# Patient Record
Sex: Male | Born: 1935 | Race: Black or African American | Hispanic: No | Marital: Single | Smoking: Current every day smoker
Health system: Southern US, Community
[De-identification: ages and names within clinical notes are randomized; demographics above are authoritative.]

## PROBLEM LIST (undated history)

## (undated) DIAGNOSIS — I110 Hypertensive heart disease with heart failure: Secondary | ICD-10-CM

## (undated) DIAGNOSIS — R911 Solitary pulmonary nodule: Secondary | ICD-10-CM

## (undated) DIAGNOSIS — E785 Hyperlipidemia, unspecified: Secondary | ICD-10-CM

## (undated) HISTORY — DX: Hypertensive heart disease with heart failure: I11.0

---

## 2003-01-24 ENCOUNTER — Inpatient Hospital Stay (HOSPITAL_COMMUNITY): Admission: EM | Admit: 2003-01-24 | Discharge: 2003-01-31 | Payer: Self-pay | Admitting: Emergency Medicine

## 2003-01-26 ENCOUNTER — Encounter (INDEPENDENT_AMBULATORY_CARE_PROVIDER_SITE_OTHER): Payer: Self-pay | Admitting: Specialist

## 2007-03-25 DIAGNOSIS — R911 Solitary pulmonary nodule: Secondary | ICD-10-CM

## 2007-03-25 HISTORY — DX: Solitary pulmonary nodule: R91.1

## 2007-05-28 ENCOUNTER — Encounter: Admission: RE | Admit: 2007-05-28 | Discharge: 2007-05-28 | Payer: Self-pay | Admitting: Emergency Medicine

## 2007-06-23 ENCOUNTER — Ambulatory Visit: Payer: Self-pay | Admitting: Thoracic Surgery

## 2007-06-30 ENCOUNTER — Ambulatory Visit: Admission: RE | Admit: 2007-06-30 | Discharge: 2007-06-30 | Payer: Self-pay | Admitting: Thoracic Surgery

## 2007-06-30 ENCOUNTER — Encounter: Payer: Self-pay | Admitting: Thoracic Surgery

## 2007-07-07 ENCOUNTER — Ambulatory Visit: Payer: Self-pay | Admitting: Thoracic Surgery

## 2007-09-01 ENCOUNTER — Ambulatory Visit: Payer: Self-pay | Admitting: Thoracic Surgery

## 2007-09-01 ENCOUNTER — Ambulatory Visit (HOSPITAL_COMMUNITY): Admission: RE | Admit: 2007-09-01 | Discharge: 2007-09-01 | Payer: Self-pay | Admitting: Thoracic Surgery

## 2007-09-01 ENCOUNTER — Encounter: Payer: Self-pay | Admitting: Thoracic Surgery

## 2007-09-02 ENCOUNTER — Ambulatory Visit: Payer: Self-pay | Admitting: Thoracic Surgery

## 2008-04-11 ENCOUNTER — Ambulatory Visit: Payer: Self-pay | Admitting: Internal Medicine

## 2008-05-15 ENCOUNTER — Emergency Department (HOSPITAL_COMMUNITY): Admission: EM | Admit: 2008-05-15 | Discharge: 2008-05-15 | Payer: Self-pay | Admitting: Family Medicine

## 2008-12-26 ENCOUNTER — Encounter: Admission: RE | Admit: 2008-12-26 | Discharge: 2008-12-26 | Payer: Self-pay | Admitting: Family Medicine

## 2010-03-28 ENCOUNTER — Emergency Department (HOSPITAL_COMMUNITY)
Admission: EM | Admit: 2010-03-28 | Discharge: 2010-03-28 | Payer: Self-pay | Source: Home / Self Care | Admitting: Emergency Medicine

## 2010-03-28 LAB — URINALYSIS, ROUTINE W REFLEX MICROSCOPIC
Bilirubin Urine: NEGATIVE
Hemoglobin, Urine: NEGATIVE
Ketones, ur: NEGATIVE mg/dL
Nitrite: NEGATIVE
Protein, ur: NEGATIVE mg/dL
Specific Gravity, Urine: 1.009 (ref 1.005–1.030)
Urine Glucose, Fasting: NEGATIVE mg/dL
Urobilinogen, UA: 0.2 mg/dL (ref 0.0–1.0)
pH: 5.5 (ref 5.0–8.0)

## 2010-03-28 LAB — POCT I-STAT, CHEM 8
BUN: 17 mg/dL (ref 6–23)
Calcium, Ion: 1.01 mmol/L — ABNORMAL LOW (ref 1.12–1.32)
Chloride: 105 mEq/L (ref 96–112)
Creatinine, Ser: 0.7 mg/dL (ref 0.4–1.5)
Glucose, Bld: 115 mg/dL — ABNORMAL HIGH (ref 70–99)
HCT: 44 % (ref 39.0–52.0)
Hemoglobin: 15 g/dL (ref 13.0–17.0)
Potassium: 5.7 mEq/L — ABNORMAL HIGH (ref 3.5–5.1)
Sodium: 135 mEq/L (ref 135–145)
TCO2: 28 mmol/L (ref 0–100)

## 2010-03-28 LAB — URINE MICROSCOPIC-ADD ON

## 2010-04-08 LAB — URINE CULTURE
Colony Count: 45000
Culture  Setup Time: 201201051903

## 2010-04-14 ENCOUNTER — Encounter: Payer: Self-pay | Admitting: Emergency Medicine

## 2010-04-14 ENCOUNTER — Encounter: Payer: Self-pay | Admitting: Thoracic Surgery

## 2010-04-14 ENCOUNTER — Encounter: Payer: Self-pay | Admitting: Family Medicine

## 2010-04-15 ENCOUNTER — Encounter: Payer: Self-pay | Admitting: Family Medicine

## 2010-08-06 NOTE — Letter (Signed)
September 02, 2007   Reuben Likes, M.D.  [address]  [place], [place] [address]   Re:  [name] [name], [name] [name]              DOB:  08-03-1933   Dear Dr. Leslee Home:   I saw this patient back today.  We finally got him to do a  bronchomediastinoscopy of his mediastinum.  His blood pressure was  118/65, pulse 84, respirations 18, and sats were 95%.  Lungs were clear  to auscultation and percussion.  His pathology showed that his  mediastinoscopy was negative.  His transbronchial biopsy showed atypical  cells that are highly suspicious for cancer, so I think it looks like he  has got a left upper lobe cancer, nonsmall cell lung cancer, and I have  recommended that he have a VATS left upper lobectomy with node  dissection.  We have tentatively scheduled for 09/28/2007, but he wants  to think about before making a final decision.   I appreciate the opportunity of seeing this patient.   Sincerely,   Ines Bloomer, M.D.  Electronically Signed   DPB/MEDQ  D:  09/02/2007  T:  09/03/2007  Job:  [id number]

## 2010-08-06 NOTE — Letter (Signed)
September 29, 2007   Mr. [name] [name]  [address]  [place], [place] [address]   Re:  MITHRAN, STRIKE              DOB:  [birth year]   Dear [name]:   You did not show up for the preoperative appointment or your scheduled  surgery on September 28, 2007.  We have been trying to contact to you and your  phones have been disconnected.  We feel that you need to contact us, so  that we can reschedule your surgery.  If not, we cannot guarantee that  you will get any type of good results from your lung cancer.  Right now,  we feel that this is the time but any wait will definitely impact your  treatment.  Please call us at [phone], so that we can reschedule.  It  is very important that you have this surgery, so that you have chance to  have a cure from your lung cancer.   Ines Bloomer, M.D.  Electronically Signed   DPB/MEDQ  D:  09/29/2007  T:  09/30/2007  Job:  [id number]

## 2010-08-06 NOTE — Assessment & Plan Note (Signed)
OFFICE VISIT   [name], [name] L  DOB:  01-21-1934                                        July 07, 2007  CHART #:  [id number]   Mr. Cure came for follow-up today after his PET scan which revealed  that the left upper lobe lesion was positive and there is a question of  positive mediastinal nodes.  He needs to have bronchoscopy and  mediastinoscopy which is what I have suggested, but he wants to discuss  this with Dr. Lorenz Coaster first.  I discussed this with he and his family.  His blood pressure is 118/72, pulse 100, respirations 18, sats 99%.  I  will see him back again in two months with a chest x-ray.  I will see  him back again if he describes to have his bronchoscopy and  mediastinoscopy.   Ines Bloomer, M.D.  Electronically Signed   DPB/MEDQ  D:  07/07/2007  T:  07/07/2007  Job:  [id number]

## 2010-08-06 NOTE — Letter (Signed)
July 07, 2007   Reuben Likes, M.D.  [address]  [place], [place] [address]   Re:  NORI, POLAND              DOB:  09/21/1933   Dear [name]:   I saw Mr. Merrick back today.  His pulmonary function tests were  improved with an FEV of 2.97, an FEV1 of 2.01, and a diffusion capacity  of 95%, which I think he would be a candidate for a lobectomy from  pulmonary function tests.  His PET scan, as suspected, was positive, not  in the left upper lobe, but there were some questionable positive nodes  in the mediastinum.  For this reason, I have recommended that he have  bronchoscopy and mediastinoscopy as a staging procedure prior to  considering surgery.  I discussed this in detail with him and his  daughter.  They want to discuss this with you, and they apparently will  call us to schedule surgery after they talk with you.   Ines Bloomer, M.D.  Electronically Signed   DPB/MEDQ  D:  07/07/2007  T:  07/07/2007  Job:  [id number]

## 2010-08-06 NOTE — H&P (Signed)
[name] [name], [name] [name]              ACCOUNT NO.:  [id number]   MEDICAL RECORD NO.:  [id number]         PATIENT TYPE:  CINP   LOCATION:                               FACILITY:  [facility]   PHYSICIAN:  Ines Bloomer, M.D. DATE OF BIRTH:  1933/08/27   DATE OF ADMISSION:  09/28/2007  DATE OF DISCHARGE:                              HISTORY & PHYSICAL   CHIEF COMPLAINT:  Left lung.   This 77 year old patient has a long history of smoking with a pack of  cigarettes a day.  He was found to have a lobulated and speculated left  upper lobe lesion.  It is 22 mm in diameters and questionable left  peritracheal adenopathy.  Pulmonary function tests showed an FEV-1 of  1.8.  Pulmonary function tests showed an FVC of 2.97 with an FEV-1 of  2.01 and 95% diffusion capacity.  He has had no hemoptysis, fever,  chills, or excessive sputum.  He underwent a PET scan, which revealed  the left upper lobe was positive and question mediastinal nodes.  Bronchoscopy and mediastinoscopy showed that there were atypical cells  in the left upper lobe suspicious for cancer and negative  mediastinoscopy.  He is scheduled for a VAT left upper lobe lobectomy  with node dissection.   His medications include Stelazine daily.   He has no allergies.   His family history is noncontributory.   SOCIAL HISTORY:  He is single, retired, smokes half-a-pack of cigarettes  a day and has tried and quit.  Does not drink alcohol on a regular  basis.   REVIEW OF SYSTEMS:  GENERAL: He has had some weight loss, loss of  appetite.  CARDIAC:  No angina or atrial fibrillation.  PULMONARY:  See  history of present illness.  GI: No nausea, vomiting, constipation, or  diarrhea.  GU: No dysuria or frequent urination.  VASCULAR:  No  claudication, DVT, or TIAs.  NEUROLOGICAL:  No dizziness, headaches,  blackouts, or seizures.  MUSCULOSKELETAL:  No arthritis.  PSYCHIATRIC:  He apparently has a history of schizophrenia and has been followed  by  Dr. Posey Rea.  EYES/ENT:  No changes in eyesight or hearing.  HEMATOLOGICAL:  No problems with bleeding, clotting disorders, or  anemia.   PHYSICAL EXAM:  This is a thin African American male in no acute  distress.  His blood pressure is 132/78, pulse 81, respirations 18, and  sats 98%.  Head is atraumatic.  Eyes, pupils are equal and reactive to  light and accommodation.  Extraocular movements are normal.  Ears,  tympanic membranes are intact.  Nose, there is no septal deviation.  Throat without lesion.  Uvula is nonenlarged.  Neck is supple without  thyromegaly.  Nose, no carotid bruits.  No supraclavicular axillary  adenopathy.  Chest is essentially clear, although there is slight wheeze  heard bilaterally.  Heart has regular sinus rhythm, no murmurs.  Abdomen  is soft.  No hepatosplenomegaly.  Extremities,  pulses are 2+.  There is  no clubbing or edema.  Neurologically, he is oriented x3.  Sensory and  motor intact.  Skin is without  lesion.   IMPRESSION:  1. Non-small cell cancer, left upper lobe.  2. Chronic obstructive pulmonary disease.  3. History of schizophrenia.  4. History of tobacco abuse.   PLAN:  Left upper lobectomy.      Ines Bloomer, M.D.  Electronically Signed     DPB/MEDQ  D:  09/23/2007  T:  09/24/2007  Job:  [id number]

## 2010-08-06 NOTE — Op Note (Signed)
[name] [name], [name] [name]              ACCOUNT NO.:  [id number]   MEDICAL RECORD NO.:  [id number]          PATIENT TYPE:  AMB   LOCATION:  DFTL                         FACILITY:  [facility]   PHYSICIAN:  Ines Bloomer, M.D. DATE OF BIRTH:  [birth year]   DATE OF PROCEDURE:  09/01/2007  DATE OF DISCHARGE:  09/01/2007                               OPERATIVE REPORT   PREOPERATIVE DIAGNOSIS:  Left upper lobe mass.   POSTOPERATIVE DIAGNOSIS:  Left upper lobe mass.   OPERATION PERFORMED:  Video bronchoscopy and mediastinoscopy.   SURGEON:  Ines Bloomer, MD   ANESTHESIA:  General anesthesia.   After general anesthesia, the video bronchoscope was passed through the  endotracheal tube.  The carina was the midline.  The right mainstem,  right upper lobe, right middle lobe, and right lower lobe orifices were  normal.  The left mainstem and left lower lobe orifices were normal.  We  then went into the left upper lobe.  The apical posterior segment did  not see any endobronchial lesions, but under fluoro guidance we did  brushings and biopsies of the lesions in the left upper lobe.  Washings  were also taken.  The video bronchoscope was removed.  The anterior neck  was prepped and draped usual sterile manner.  A transverse incision was  made, it was carried down the electrocautery through the subcutaneous  tissue and fascia.  Pretracheal fascia was entered and biopsies of 4R  and 4L nodes were done.  Strap muscles were closed with 2-0 Vicryl,  subcutaneous tissue with 3-0 Vicryl, and Dermabond for the skin.  The  patient was returned to the recovery room in stable condition.      Ines Bloomer, M.D.  Electronically Signed     DPB/MEDQ  D:  09/01/2007  T:  09/01/2007  Job:  [id number]

## 2010-08-06 NOTE — Letter (Signed)
June 23, 2007   [name] [name], M.D.  Family Practice  [address]  [place], [place]  [address]   Re:  [name], [name]                DOB:  [birth year]   Dear [name] [name]:   I appreciate the opportunity to have seen [name] [name].  This 77 year old  African American male has a long history of smoking and still smokes at  least a pack of cigarettes a day.  He was found to have a lobulated  speculated lesion in the left upper lobe of approximately 22-mm in  diameter and some questionable left paratracheal adenopathy.  He has  severe chronic obstructive pulmonary disease.  His screening and  pulmonary function tests show an FEV of 1.84 and an FEV1 of 0.54, 20% of  predicted.  He has been followed by Dr. Donell Beers for schizophrenia.  He  has also had a history of weight loss.  He has no hemoptysis, fever,  chills, or excessive sputum.   MEDICATIONS:  Stelazine 1000 mg daily.   ALLERGIES:  He has no allergies.   FAMILY HISTORY:  Noncontributory.   SOCIAL HISTORY:  He  is single, retired.  He smokes 1-1/2 packs of  cigarettes a day.  He does not drink alcohol on a regular basis.   REVIEW OF SYSTEMS:  He has had weight loss, loss of appetite.  CARDIAC:  No angina or atrial fibrillation.  PULMONARY:   See History of Present  Illness.  GI:  No nausea, vomiting, constipation, or diarrhea.  GU:  No  kidney disease, dysuria, or frequent urination.  VASCULAR:  No  claudication, DVT, TIAs.  NEUROLOGIC:  No headaches, blackouts, or  seizures.  MUSCULOSKELETAL:  No arthritis or joint pain.  PSYCHIATRIC:  See History of Present Illness.  EYES/ENT:  No changes in his eyesight  or hearing.  HEMATOLOGIC:  No problems with bleeding, clotting  disorders, or anemia.   PHYSICAL EXAM:  He is a thin African American male in no acute distress.  His blood pressure was 132/78, pulse 81, respirations 18, saturations  98%.  HEAD, EYES, EARS, NOSE AND THROAT:  Unremarkable.  NECK:  Supple without thyromegaly.   There is no supraclavicular or  axillary adenopathy.  CHEST:  Clear to auscultation and percussion, but there are some  bilateral wheezes.  HEART:  Regular sinus rhythm, no murmurs.  ABDOMEN:  Soft, there is no hepatosplenomegaly.  Pulses are 2+, there is  no clubbing or edema.  NEUROLOGIC:  He is oriented x3.  Sensory and motor are intact.   I feel [name] [name] probably has a primary lung cancer.  We plan to get a  PET scan and a brain scan and a full set of pulmonary function tests and  we will see him back to decide whether to do a needle biopsy or a  bronchoscopy for him.  I doubt, given his screening pulmonary function  tests, that he is a candidate for surgery.  I have also cautioned him to  stop any smoking no matter whether he has surgery or not.  I appreciate  the opportunity of seeing [name] [name].   Sincerely,   Ines Bloomer, M.D.  Electronically Signed   DPB/MEDQ  D:  06/23/2007  T:  06/23/2007  Job:  [id number]

## 2010-08-09 NOTE — Consult Note (Signed)
NAME:  COBURN, KNAUS                        ACCOUNT NO.:  [id number]   MEDICAL RECORD NO.:  [id number]                   PATIENT TYPE:  EMS   LOCATION:  MAJO                                 FACILITY:  [facility]   PHYSICIAN:  Coletta Memos, M.D.                  DATE OF BIRTH:  09/04/1923   DATE OF CONSULTATION:  01/24/2003  DATE OF DISCHARGE:                                   CONSULTATION   REASON FOR CONSULTATION:  Traumatic intracranial hemorrhage.   INDICATIONS:  [name] [name] is a 77 year old gentleman who while waiting in  line to vote today fell, hit his head on the pavement. He actually did not  complain of any head pain but did complain of feeling weak and dizzy. He had  some bleeding of the left ear. He was brought to the emergency room for  further evaluation. On arrival, he had a Glasgow coma scale of 15. He was  neurologically normal. Head CT was done, and it showed a small amount of  blood in the right region of the frontal lobe. He also appeared to have a  left temporal bone fracture which the cause of the blood in the left ear. I  was consulted.   PAST MEDICAL HISTORY:  Includes nerves. He has no known drug allergies. He  takes Stelazine for his nerves. He is treated at [facility]. He had been  standing for approximately one hour when he fell. He did feel somewhat  dizzy. He was unconscious for less than five seconds. Never had symptoms  like this in the past. Does not use alcohol, smoke, or illicit drugs. He is  currently retired. Possible history of hypertension in the past.   REVIEW OF SYSTEMS:  Denies constitutional; eyes, ears, nose, throat, and  mouth; respiratory; cardiovascular; CNS; urinary; gastrointestinal; skin;  neurological; psychiatric; endocrine; hematological; or allergic problems.   He had hematocrit of 27.1. Glucose of 127. An EKG which I believe was read  as normal.   PHYSICAL EXAMINATION:  VITAL SIGNS:  Blood pressure of 107 systolic. Pulse  of 96. Blood pressure 196/90. Respiratory rate 29.  NEUROLOGICAL:  On examination, he is oriented x4, and he answers all  questions appropriately. Memory length, attention span, and fund of  knowledge normal. No drift on exam. Normal strength in the upper and lower  extremities. He does have a tremor in the upper and lower extremities. Uvula  elevates midline. Shoulder shrug is normal. Tongue protrudes in the midline.  Speech is clear and fluent. He knows the year, date, and reason for  admission. He has normal muscle tone, bulk, and coordination.  LUNGS:  Lung fields clear.  HEART:  Regular rate and rhythm. No murmurs or rubs. Pulses good at the  wrists and feet bilaterally.   STUDIES:  Head CT shows a miniscule amount of blood capping one of the gyri  in the right frontal lobe.  There is no mass effect. No subarachnoid. No  epidural hematoma. There is a left temporal bone fracture with some blood  seen in the external auditory canal. Basal cisterns are widely patent.  Ventricles are normal in configuration.   DIAGNOSIS:  Traumatic intracranial hemorrhage with short loss of  consciousness.   The patient has normal neurological examination. He will be admitted to the  ACU for overnight observation. Temporal bone cuts would be helpful in  delineating the fracture. ENT has been consulted but does not need to see  the patient until morning. There does not appear to be any deficit with  hearing.                                               Coletta Memos, M.D.    KC/MEDQ  D:  01/24/2003  T:  01/25/2003  Job:  [id number]

## 2010-08-09 NOTE — Consult Note (Signed)
[name] [name], [name] [name]                        ACCOUNT NO.:  [id number]   MEDICAL RECORD NO.:  [id number]                   PATIENT TYPE:  INP   LOCATION:  3104                                 FACILITY:  [facility]   PHYSICIAN:  Zola Button T. Lazarus Salines, M.D.              DATE OF BIRTH:  09/04/1923   DATE OF CONSULTATION:  01/25/2003  DATE OF DISCHARGE:                                   CONSULTATION   CHIEF COMPLAINT:  Head trauma.   HISTORY OF PRESENT ILLNESS:  This 77 year old black male apparently came  near to fainting while waiting in line to vote yesterday.  He fell and  struck the back of his head.  He feels like he caught himself partway down.  He had bleeding from his ear and altered mental status.  He was admitted for  observation.  An initial CT scan showed several areas of cerebral contusion,  subarachnoid hemorrhage, and what appeared to be a mastoid fracture.  A  followup scan this morning showed some fluid in the mastoid and a small  amount of fluid in the canal, but no obvious fracture.  The patient denies  dizziness or tinnitus.  He feels like his hearing is decent on both sides.  He has never had trouble with his ears before.   PHYSICAL EXAMINATION:  GENERAL APPEARANCE:  This is a thin, energetic, black  male appearing younger than stated age of 40.  Mental status may be very  slightly confused.  HEENT:  He has a small occipital hematoma.  He has no swelling,  lacerations, or hematomas behind the ears on either side.  He had a small  amount of old blood in the conchal fold on the left side.  With speculum  examination, the right canal is clear with a normal intact drum.  On the  left side, there is a small amount of old blood in the canal, but no gross  lacerations visible.  The drum looks to be intact without hemotympanum.  Cranial nerves, including facial, intact on both sides.  No nystagmus.  Tuning fork testing lateralized to the left with a 512 Hz fork and Rinne was  normal on both sides.  The anterior nose is clear.  The oral cavity reveals  teeth in fair to poor repair with no acute trauma.  No blood in the pharynx.  NECK:  Unremarkable.   IMPRESSION:  Blood in the external auditory canal and slight mastoid fluid  suggesting a possible mastoid or external canal fracture, but no evidence on  the CT scan this morning.  Facial nerve, tympanic membrane, and gross  hearing intact.  No nystagmus.  I do not feel like he has had a significant  injury to the ear.   PLAN:  I would like him to continue with Cortisporin drops to his ear for  five days to evaluate old blood.  I will see him back in  my office in four  to six weeks, including evaluation and audiometry.  I have discussed this  with the patient.  Please call if further assistance is desired.                                               Gloris Manchester. Lazarus Salines, M.D.   KTW/MEDQ  D:  01/25/2003  T:  01/25/2003  Job:  [id number]   cc:   Coletta Memos, M.D.  [address]  [place]  [place] [address]  Fax: [phone]

## 2010-08-09 NOTE — Discharge Summary (Signed)
[name] [name], [name] [name]                        ACCOUNT NO.:  [id number]   MEDICAL RECORD NO.:  [id number]                   PATIENT TYPE:  INP   LOCATION:  5702                                 FACILITY:  [facility]   PHYSICIAN:  Gabrielle Dare. Janee Morn, M.D.             DATE OF BIRTH:  05/26/1933   DATE OF ADMISSION:  DATE OF DISCHARGE:  01/31/2003                                 DISCHARGE SUMMARY   CONSULTANTS:  1. Dr. Coletta Memos, Neurosurgery.  2. Dr. Minda Meo T. [facility], otolaryngology.  3. Dr. Teena Irani. Arlyce Dice, Gastroenterology.   MEDICAL TEACHING SERVICE CONSULTATION:  Dr. Steele Berg Phifer, Attending.   DISCHARGE DIAGNOSES:  1. Status post fall from level ground.  2. 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  3. Possible left mastoid fracture.  4. Microcytic anemia requiring transfusion.  5. Duodenitis, pathology pending.  6. Diverticular disease, left side/ diverticulosis.  7. Status post transfusion two units packed red blood cells during     hospitalization.  8. Hypertension.  9. Syncope likely secondary to severe anemia.  10.      History of some type of anxiety disorder.  The patient is followed     at [facility].   PROCEDURES:  1. The patient had EGD on January 27, 2003, which showed duodenitis,     pathology was pending, for H pylori.  2. Colonoscopy to the cecum showed diverticulosis, left side, small bowel     follow through showed no gross problems.   HISTORY:  This is a 77 year old black male who fainted while standing in  line to vote.  He fell backwards and struck his head.  He had no purported  loss of consciousness.  He was complaining of mild headache and was bleeding  from his left ear at the time of his presentation.  Workup at this time  showed a rather profound anemia with a hemoglobin of 8.4, hematocrit 28,  platelets 482,000.  White blood cell count 10,900.   CT scan of the  head showed a traumatic brain injury with right frontal  subarachnoid hemorrhage and left Sylvian fissure subarachnoid hemorrhage as  well as a left mastoid fracture.  Secondary to this, the patient was  admitted for observation to the intensive care unit, on the ACU side.  Dr.  Franky Macho saw the patient in consultation, and recommended observation and  serial CT.  The patient remained stable overnight.  He was seen by Dr.  Lazarus Salines in consultation for a possible left mastoid fracture and followup CT  scan with fine cuts through the temporal bones was done and were reviewed  and showed slight mastoid fluid, and this was possibly again felt to be a  possible mastoid fracture.  This, however, did not show clearly on CT scan.  The patient's hearing was intact.  He did have blood in the  external canal  on the left, and Dr. Lazarus Salines recommended Cortisporin drops x5 days to clear  the blood, as well as follow up in  four to six weeks for reevaluation and  audiogram.  The patient did well from a neurologic standpoint, and his  followup CT scan showed improvement in his subarachnoid hemorrhage.  He did  have an anterior left frontal lobe contusion, and this was more apparent on  followup exam.  However, clinically, the patient was doing quite well.  As  noted, he did have a significant anemia, and internal medicine was consulted  for evaluation of this as well as for workup of the patient's syncope.  The  patient had had cardiac assessment, and with cardiac enzymes at the time of  his admission to rule out that as a source of his syncope, and the enzymes  were normal.  EKG was normal, and he was normal on monitoring throughout his  hospitalization.  Again, further workup for his anemia revealed microcytic  anemia with GI workup including upper endoscopy showing duodenitis and  diverticular disease in the left colon.  It was felt that his anemia was  likely related to GI loss, and the patient was  transfused two units of  packed red blood cells and tolerated this well.  He was also placed on iron  supplementation and vitamin C.  No further GI workup was recommended at this  time unless the patient had persistent heme positive stools or persistent  anemia.   The patient continued to do well, and will be discharged home with family.  He was started on some hydrochlorothiazide near the time of discharge for  mild hypertension, and this will need to be followed on an outpatient basis.   DISCHARGE MEDICATIONS:  1. Hydrochlorothiazide 12.5 mg p.o. q.a.m.  2. Stelazine 10 mg p.o. q.h.s. per the patient's usual home dose.  3. Ferrous sulfate, 325 mg p.o. daily.  4. Multivitamin one daily.  5. Tylenol as needed for mild pain.  6. Vicodin one as needed q.4-6h. p.r.n. more severe pain.   FOLLOWUP:  1. He is to follow up with his primary care physician or with [facility]     concerning his hypertension and overall status as well as his anemia.  2. Follow up with trauma service as needed.  3. Follow up with Dr. Franky Macho as needed.  4. Follow up with Dr. Lazarus Salines in four to six weeks.  He is to call for this     appointment.      Shawn Rayburn, P.A.                       Gabrielle Dare Janee Morn, M.D.    SR/MEDQ  D:  01/31/2003  T:  01/31/2003  Job:  [id number]

## 2010-08-09 NOTE — H&P (Signed)
NAME:  [name] [name], [name] [name]                        ACCOUNT NO.:  [id number]   MEDICAL RECORD NO.:  [id number]                   PATIENT TYPE:  EMS   LOCATION:  [facility]                                 FACILITY:  [facility]   PHYSICIAN:  Gabrielle Dare. Janee Morn, M.D.             DATE OF BIRTH:  09/04/1923   DATE OF ADMISSION:  01/24/2003  DATE OF DISCHARGE:                                HISTORY & PHYSICAL   REASON FOR ADMISSION:  Head injury status post fall.   HISTORY OF PRESENT ILLNESS:  The patient is a 77 year old African-American  male who was standing in line waiting to vote today when he fell backwards  after feeling lightheaded and struck his head.  He had no loss of  consciousness.  He complains of some mild headache at this time and some  blood coming from his left ear.  He has no other complaints.   PAST MEDICAL HISTORY:  Nerves.   PAST SURGICAL HISTORY:  He denies.   SOCIAL HISTORY:  He smokes cigarettes, but does not drink alcohol.   MEDICATIONS:  1. Stelazine.  He has taken that recently, but has been out of his     medication and has not taken that for several days.  2. He also takes an occasional Advertising account executive.   PRIMARY CARE PHYSICIAN:  Dr. Charna Elizabeth.   ALLERGIES:  No known drug allergies.   REVIEW OF SYSTEMS:  CONSTITUTIONAL:  Negative.  HEENT:  Blood in left ear.  CARDIOVASCULAR:  Negative.  PULMONARY:  Negative.  GASTROINTESTINAL:  Negative.  GENITOURINARY:  Negative.  MUSCULOSKELETAL:  See above.  NEUROPSYCHIATRIC:  Nerves.   PHYSICAL EXAMINATION:  VITAL SIGNS:  Pulse is 89, blood pressure 188/89,  respirations 22, temperature 98.7, saturation 100%.  SKIN:  Warm.  HEENT:  He has a small hematoma on his occiput.  Extraocular muscles are  intact.  Pupils are 2 mm and reactive bilaterally.  His left ear canal has  blood with the tympanic membrane obscured.  Right ear exam was clear.  NECK:  Nontender with no swelling.  LUNGS:  Clear to auscultation bilaterally.  HEART:   Regular rate and rhythm.  ABDOMEN:  Soft, nontender.  BACK:  Atraumatic.  RECTAL:  There is normal tone with no blood.  GENITOURINARY:  No meatal blood.  EXTREMITIES:  Atraumatic.  PELVIC:  Stable to palpation.  NEUROLOGIC:  He is alert and oriented.  Memory is intact.  Cranial nerves II-  XII are intact.  Upper and lower extremity strength and sensation are  intact.  VASCULAR:  Intact.   LABORATORY DATA:  Sodium 138, potassium 4.3, chloride 105, CO2 24, BUN 12,  creatinine 0.9, glucose 127.  White blood cell count 10.9, hemoglobin 8.4,  hematocrit 28.1, platelets 482.  Troponin's were less than 0.05 x3.  CT scan  of the head shows left mastoid fracture, right frontal subarachnoid  hemorrhage/contusion, left sylvian fissure, subarachnoid hemorrhage.  IMPRESSION AND PLAN:  The patient is a 77 year old African-American male  status post fall with the following injuries:  Traumatic brain injury with  right frontal subarachnoid and left sylvian subarachnoid hemorrhages, left  mastoid fracture, anemia.  We will plan to admit him to [facility],  neurosurgical consultation was obtained, Dr. Alben Deeds, ENT consultation was  obtained, Dr. Lazarus Salines.  We will repeat a CT scan in the a.m. with a fine cut  through his temporal bone.  In regards to his anemia, we will check for  fecal and occult blood, though his rectal exam was negative.  This may need  some further workup as an inpatient or an outpatient.                                                Gabrielle Dare Janee Morn, M.D.    BET/MEDQ  D:  01/24/2003  T:  01/24/2003  Job:  [id number]

## 2010-12-19 LAB — COMPREHENSIVE METABOLIC PANEL
ALT: 23
AST: 29
Albumin: 3.7
Alkaline Phosphatase: 113
BUN: 6
CO2: 27
Calcium: 9.4
Chloride: 106
Creatinine, Ser: 0.75
GFR calc Af Amer: >60
GFR calc non Af Amer: >60
Glucose, Bld: 90
Potassium: 4.5
Sodium: 141
Total Bilirubin: 0.4
Total Protein: 6.6

## 2010-12-19 LAB — CBC
HCT: 40.9
Hemoglobin: 13.5
MCHC: 33.1
MCV: 85.6
Platelets: 247
RBC: 4.78
RDW: 16.1 — ABNORMAL HIGH
WBC: 5.7

## 2010-12-19 LAB — TYPE AND SCREEN
ABO/RH(D): O POS
Antibody Screen: NEGATIVE

## 2010-12-19 LAB — CULTURE, RESPIRATORY W GRAM STAIN

## 2010-12-19 LAB — PROTIME-INR
INR: 1
Prothrombin Time: 13.2

## 2010-12-19 LAB — APTT: aPTT: 34

## 2010-12-19 LAB — AFB CULTURE WITH SMEAR (NOT AT ARMC): Acid Fast Smear: NONE SEEN

## 2010-12-19 LAB — FUNGUS CULTURE W SMEAR: Fungal Smear: NONE SEEN

## 2010-12-19 LAB — ABO/RH: ABO/RH(D): O POS

## 2011-11-06 ENCOUNTER — Ambulatory Visit (HOSPITAL_COMMUNITY)
Admission: RE | Admit: 2011-11-06 | Discharge: 2011-11-06 | Disposition: A | Discharge status: Home / Self Care | Payer: Medicare Other | Attending: Psychiatry | Admitting: Psychiatry

## 2011-11-06 DIAGNOSIS — F39 Unspecified mood [affective] disorder: Secondary | ICD-10-CM | POA: Insufficient documentation

## 2011-11-06 NOTE — BH Assessment (Signed)
Assessment Note   [name] [name] is an 78 y.o. male. PT PRESENTED WITH HIS NIECE WHO WAS BEING ASSESSED FOR DEPRESSION AND INSISTED HER UNCLE BE ASSESSED ALSO. PT DENIES ANY MENTAL HEALTH HX AND DENIES S/I, H/I AND IS NOT PSYCHOTIC.  PT REPORTS HE NEEDS PLACEMENT IN A NURSING HOME OR ASSISTANT LIVING BECAUSE HIS NIECE CAN NO LONGER CARE FOR HIM.       Axis I: Mood Disorder NOS Axis II: Deferred Axis III: No past medical history on file. Axis IV: housing problems Axis V: 61-70 mild symptoms       Past Medical History: No past medical history on file.  No past surgical history on file.  Family History: No family history on file.  Social History:  does not have a smoking history on file. He does not have any smokeless tobacco history on file. His alcohol and drug histories not on file.  Additional Social History:  Alcohol / Drug Use Pain Medications: na Prescriptions: na Over the Counter: na History of alcohol / drug use?: No history of alcohol / drug abuse  CIWA:   COWS:    Allergies: Allergies not on file  Home Medications:  (Not in a hospital admission)  OB/GYN Status:  No LMP for male patient.  General Assessment Data Location of Assessment: [facility] Assessment Services Living Arrangements: Other relatives Can pt return to current living arrangement?: Yes Admission Status: Voluntary Is patient capable of signing voluntary admission?: Yes Transfer from: Home Referral Source: Self/Family/Friend     Risk to self Suicidal Ideation: No Suicidal Intent: No Is patient at risk for suicide?: No Suicidal Plan?: No Access to Means: No What has been your use of drugs/alcohol within the last 12 months?: na Previous Attempts/Gestures: No How many times?: 0  Intentional Self Injurious Behavior: None Family Suicide History: No Recent stressful life event(s): Other (Comment) (housing) Persecutory voices/beliefs?: No Depression: No Suicide prevention information  given to non-admitted patients: Yes  Risk to Others Thoughts of Harm to Others: No Current Homicidal Intent: No Current Homicidal Plan: No Access to Homicidal Means: No History of harm to others?: No Assessment of Violence: None Noted Does patient have access to weapons?: No Criminal Charges Pending?: No Does patient have a court date: No  Psychosis Hallucinations: None noted Delusions: None noted  Mental Status Report Appear/Hygiene: Improved Eye Contact: Good Motor Activity: Freedom of movement Speech: Logical/coherent Level of Consciousness: Alert Mood: Other (Comment) (concerned) Affect: Appropriate to circumstance Anxiety Level: Minimal Thought Processes: Coherent;Relevant Judgement: Unimpaired Orientation: Person;Place;Time;Situation Obsessive Compulsive Thoughts/Behaviors: None  Cognitive Functioning Concentration: Normal Memory: Recent Intact;Remote Intact IQ: Average Insight: Good Impulse Control: Good Appetite: Good Sleep: No Change Total Hours of Sleep: 6  Vegetative Symptoms: None  ADLScreening [facility]) Independently performs ADLs?: Yes (appropriate for developmental age)  Abuse/Neglect [facility]) Physical Abuse: Denies Verbal Abuse: Denies Sexual Abuse: Denies  Prior Inpatient Therapy Prior Inpatient Therapy: No Prior Therapy Dates: na Prior Therapy Facilty/Provider(s): na Reason for Treatment: na  Prior Outpatient Therapy Prior Outpatient Therapy: No Prior Therapy Dates: na Prior Therapy Facilty/Provider(s): na Reason for Treatment: na  ADL Screening (condition at time of admission) Independently performs ADLs?: Yes (appropriate for developmental age) Weakness of Legs: None Weakness of Arms/Hands: None  Home Assistive Devices/Equipment Home Assistive Devices/Equipment: None  Therapy Consults (therapy consults require a physician order) PT Evaluation Needed: No OT Evalulation Needed: No SLP Evaluation Needed:  No Abuse/Neglect Assessment (Assessment to be complete while patient is alone) Physical Abuse: Denies  Verbal Abuse: Denies Sexual Abuse: Denies Values / Beliefs Cultural Requests During Hospitalization: None Spiritual Requests During Hospitalization: None Consults Spiritual Care Consult Needed: No Social Work Consult Needed: No Merchant navy officer (For Healthcare) Advance Directive: Patient does not have advance directive;Patient would not like information Pre-existing out of facility DNR order (yellow form or pink MOST form): No Nutrition Screen Diet: Regular Unintentional weight loss greater than 10lbs within the last month: No Problems chewing or swallowing foods and/or liquids: No Home Tube Feeding or Total Parenteral Nutrition (TPN): No Patient appears severely malnourished: No  Additional Information 1:1 In Past 12 Months?: No CIRT Risk: No Elopement Risk: No Does patient have medical clearance?: No     Disposition: REFERRED TO DSS Disposition Disposition of Patient: Referred to (dss) Patient referred to: Other (Comment) (dss)  On Site Evaluation by:   Reviewed with Physician:     Hattie Perch Winford 11/06/2011 1:31 PM

## 2011-11-29 ENCOUNTER — Emergency Department (HOSPITAL_COMMUNITY): Payer: Medicare Other

## 2011-11-29 ENCOUNTER — Emergency Department (HOSPITAL_COMMUNITY)
Admission: EM | Admit: 2011-11-29 | Discharge: 2011-12-01 | Disposition: A | Discharge status: Home / Self Care | Payer: Medicare Other | Attending: Emergency Medicine | Admitting: Emergency Medicine

## 2011-11-29 ENCOUNTER — Emergency Department (HOSPITAL_COMMUNITY)
Admission: EM | Admit: 2011-11-29 | Discharge: 2011-11-29 | Disposition: A | Discharge status: Home / Self Care | Payer: Medicare Other | Attending: Emergency Medicine | Admitting: Emergency Medicine

## 2011-11-29 ENCOUNTER — Encounter (HOSPITAL_COMMUNITY): Payer: Self-pay | Admitting: Emergency Medicine

## 2011-11-29 DIAGNOSIS — F209 Schizophrenia, unspecified: Secondary | ICD-10-CM

## 2011-11-29 DIAGNOSIS — F17200 Nicotine dependence, unspecified, uncomplicated: Secondary | ICD-10-CM | POA: Insufficient documentation

## 2011-11-29 DIAGNOSIS — IMO0001 Reserved for inherently not codable concepts without codable children: Secondary | ICD-10-CM | POA: Insufficient documentation

## 2011-11-29 DIAGNOSIS — Z711 Person with feared health complaint in whom no diagnosis is made: Secondary | ICD-10-CM

## 2011-11-29 DIAGNOSIS — F1010 Alcohol abuse, uncomplicated: Secondary | ICD-10-CM | POA: Insufficient documentation

## 2011-11-29 DIAGNOSIS — G9389 Other specified disorders of brain: Secondary | ICD-10-CM | POA: Insufficient documentation

## 2011-11-29 NOTE — ED Provider Notes (Addendum)
History     CSN: [id number]  Arrival date & time 11/29/11  1130   First MD Initiated Contact with Patient 11/29/11 1150      Chief Complaint  Patient presents with  . Generalized Body Aches    (Consider location/radiation/quality/duration/timing/severity/associated sxs/prior treatment) The history is provided by the patient.  [name] [name] is a 78 y.o. male chronic alcoholic, smoker, who came by EMS upon request of his niece. He was missing last night but he is usually homeless. He has been staying with his niece whenever he is allowed to. He said that he slept on the street yesterday. Denies falls or trauma. Last drink was yesterday. Denies drug use or fever or chest pain or abdominal pain. Denies hallucinations or suicidal or homicidal ideations. Denies tremors.    History reviewed. No pertinent past medical history.  History reviewed. No pertinent past surgical history.  No family history on file.  History  Substance Use Topics  . Smoking status: Current Everyday Smoker -- 0.5 packs/day  . Smokeless tobacco: Never Used  . Alcohol Use: Yes      Review of Systems  All other systems reviewed and are negative.    Allergies  Review of patient's allergies indicates no known allergies.  Home Medications   Current Outpatient Rx  Name Route Sig Dispense Refill  . CHLORPROMAZINE HCL 25 MG PO TABS Oral Take 25 mg by mouth as needed.      BP 112/73  Pulse 96  Temp 99 F (37.2 C) (Oral)  Resp 20  SpO2 99%  Physical Exam  Nursing note and vitals reviewed. Constitutional: He is oriented to person, place, and time.       Disheveled, NAD no alcohol on breath.   HENT:  Head: Normocephalic and atraumatic.  Mouth/Throat: Oropharynx is clear and moist.  Eyes: Conjunctivae are normal. Pupils are equal, round, and reactive to light.  Neck: Normal range of motion. Neck supple.  Cardiovascular: Normal rate, regular rhythm and normal heart sounds.   Pulmonary/Chest:  Effort normal and breath sounds normal.  Abdominal: Soft. Bowel sounds are normal.  Musculoskeletal: Normal range of motion.  Neurological: He is alert and oriented to person, place, and time. No cranial nerve deficit.  Skin: Skin is warm and dry.  Psychiatric: He has a normal mood and affect. His behavior is normal. Judgment and thought content normal.    ED Course  Procedures (including critical care time)  Labs Reviewed - No data to display Ct Head Wo Contrast  11/29/2011  *RADIOLOGY REPORT*  Clinical Data: Generalized body aches.  CT HEAD WITHOUT CONTRAST  Technique:  Contiguous axial images were obtained from the base of the skull through the vertex without contrast.  Comparison: Head CT 03/28/2010.  Findings: Mild cerebral and cerebellar atrophy.  Patchy and confluent areas of decreased attenuation throughout the deep and periventricular white matter of the cerebral hemispheres bilaterally, compatible with chronic microvascular ischemic changes.  No definite acute intracranial abnormality. Specifically, no definite signs of acute/subacute cerebral ischemia, no evidence of acute intracranial hemorrhage, no focal mass, mass effect, hydrocephalus or abnormal intra or extra-axial fluid collections.  Visualized paranasal sinuses are remarkable for areas of mucoperiosteal thickening in the left maxillary sinus, compatible with chronic sinusitis.  Mastoids are well pneumatized bilaterally.  No acute displaced skull fractures are identified.  IMPRESSION: 1.  No acute intracranial abnormalities. 2.  Mild cerebral and cerebellar atrophy with chronic microvascular ischemic changes throughout the deep and periventricular white matter of the  cerebral hemispheres bilaterally. 3.  Changes of chronic sinusitis in the left maxillary sinus.   Original Report Authenticated By: Florencia Reasons, M.D.      No diagnosis found.    MDM  [name] [name] is a 78 y.o. male here with eval. He doesn't appear  intoxicated. Social work will call niece and try to find a safe home to go to.   1:31 PM Social work unable to reach niece. Patient states that he has a shelter to go. CT head performed and is normal. He doesn't appear intoxicated and has no hallucinations. Patient is no longer tachycardic. I gave him a referral list and list of shelters.         Richardean Canal, MD 11/29/11 1332  Richardean Canal, MD 11/29/11 [phone]

## 2011-11-29 NOTE — ED Notes (Signed)
Apparently pt was missing last noc and slept outside last noc. Silver Alert. Pt w/ hx of dementia, wandering streets all night, found this a.m. At shelter. Wants him checked out. Pt current caregiver is hospitalized.

## 2011-11-29 NOTE — ED Notes (Signed)
CSW met with pt per request of MD. Per MD note, pt is currently homeless when he does not live with her niece. CSW met with pt by bedside to discuss discharge plan. Pt verbalized that his niece Jad Johansson may be able to pick him up upon discharge. CSW telephoned Osiah Haring (number listed on facesheet) but was unable to reach her. CSW reported this to patient who informed writer that he desires to go to [facility] and that he will contact her once he arrives. CSW provided pt with bus pass for transportation upon discharge. CSW notified MD. No other concerns verbalized by pt at this time. CSW will remain available to pt in the event that additional assistance is needed.   Janann Colonel., MSW, [facility] Clinical Social Worker [phone]

## 2011-11-29 NOTE — ED Notes (Signed)
Pt found to be wandering streets by [employer], per [employer] papers pt needs medical care, has poor eating habits and is homeless. Pt seen here for same. Unable to contact pts neice

## 2011-11-29 NOTE — ED Notes (Signed)
Pt was brought in by his caregiver to be evaluated.  Pt was reported missing last night by his niece who is presently admitted in the ED.  Pt is A&Ox4.  Very pleasant and cooperative.  Coffee given to pt-pt warned coffee is very hot.  Pt declined food offered by this nurse at this time.  Pt sitting in the chair.  Appears comfortable

## 2011-11-30 LAB — CBC WITH DIFFERENTIAL/PLATELET
Basophils Absolute: 0 10*3/uL (ref 0.0–0.1)
Basophils Relative: 0 % (ref 0–1)
HCT: 40.6 % (ref 39.0–52.0)
Hemoglobin: 13.6 g/dL (ref 13.0–17.0)
Lymphocytes Relative: 17 % (ref 12–46)
MCHC: 33.5 g/dL (ref 30.0–36.0)
Monocytes Absolute: 0.6 10*3/uL (ref 0.1–1.0)
Neutro Abs: 6.2 10*3/uL (ref 1.7–7.7)
Neutrophils Relative %: 75 % (ref 43–77)
RDW: 14.3 % (ref 11.5–15.5)
WBC: 8.3 10*3/uL (ref 4.0–10.5)

## 2011-11-30 LAB — COMPREHENSIVE METABOLIC PANEL
ALT: 24 U/L (ref 0–53)
AST: 33 U/L (ref 0–37)
Albumin: 3.6 g/dL (ref 3.5–5.2)
Alkaline Phosphatase: 118 U/L — ABNORMAL HIGH (ref 39–117)
CO2: 29 mEq/L (ref 19–32)
Chloride: 101 mEq/L (ref 96–112)
Creatinine, Ser: 0.9 mg/dL (ref 0.50–1.35)
GFR calc non Af Amer: 81 mL/min — ABNORMAL LOW (ref >90)
Potassium: 4.3 mEq/L (ref 3.5–5.1)
Total Bilirubin: 0.2 mg/dL — ABNORMAL LOW (ref 0.3–1.2)

## 2011-11-30 LAB — URINALYSIS, ROUTINE W REFLEX MICROSCOPIC
Bilirubin Urine: NEGATIVE
Glucose, UA: NEGATIVE mg/dL
Hgb urine dipstick: NEGATIVE
Ketones, ur: NEGATIVE mg/dL
Protein, ur: NEGATIVE mg/dL
Urobilinogen, UA: 0.2 mg/dL (ref 0.0–1.0)

## 2011-11-30 LAB — URINE MICROSCOPIC-ADD ON

## 2011-11-30 LAB — RAPID URINE DRUG SCREEN, HOSP PERFORMED
Amphetamines: NOT DETECTED
Benzodiazepines: NOT DETECTED
Cocaine: NOT DETECTED

## 2011-11-30 MED ORDER — ZOLPIDEM TARTRATE 5 MG PO TABS: 5.0000 mg | ORAL_TABLET | Freq: Every evening | ORAL | Status: DC | PRN

## 2011-11-30 MED ORDER — CHLORPROMAZINE HCL 25 MG PO TABS: 25.0000 mg | ORAL_TABLET | ORAL | Status: DC | PRN

## 2011-11-30 MED ORDER — NICOTINE 21 MG/24HR TD PT24
21.0000 mg | MEDICATED_PATCH | Freq: Every day | TRANSDERMAL | Status: DC
  Administered 2011-11-30 – 2011-12-01 (×2): 21 mg via TRANSDERMAL
  Filled 2011-11-30 (×2): qty 1

## 2011-11-30 MED ORDER — ALUM & MAG HYDROXIDE-SIMETH 200-200-20 MG/5ML PO SUSP: 30.0000 mL | ORAL | Status: DC | PRN

## 2011-11-30 MED ORDER — ONDANSETRON HCL 4 MG PO TABS: 4.0000 mg | ORAL_TABLET | Freq: Three times a day (TID) | ORAL | Status: DC | PRN

## 2011-11-30 MED ORDER — IBUPROFEN 200 MG PO TABS: 600.0000 mg | ORAL_TABLET | Freq: Three times a day (TID) | ORAL | Status: DC | PRN

## 2011-11-30 MED ORDER — ACETAMINOPHEN 325 MG PO TABS
650.0000 mg | ORAL_TABLET | ORAL | Status: DC | PRN
Start: 2011-11-30 — End: 2011-12-01

## 2011-11-30 NOTE — ED Provider Notes (Signed)
Medical screening examination/treatment/procedure(s) were performed by non-physician practitioner and as supervising physician I was immediately available for consultation/collaboration.  Tyona Nilsen, MD 11/30/11 0717 

## 2011-11-30 NOTE — ED Provider Notes (Signed)
History     CSN: [id number]  Arrival date & time 11/29/11  2112   First MD Initiated Contact with Patient 11/29/11 2212      Chief Complaint  Patient presents with  . Medical Clearance    (Consider location/radiation/quality/duration/timing/severity/associated sxs/prior treatment) HPI  Patient to the ER by [employer] for wondering and being confused on the street. Patient was seen earlier today Dr. Silverio Lay by request of his niece who is his caretaker. The niece is homeless and has been accepted into behavioral health. It was originally believed that the patient is demented but according to his PMH he has schizophrenia (see note from September 23, 2007) and is not on medications for it. He was seen by Child psychotherapist given a bus pass and directed towards a shelter. The patient unable to make it to the shelter as he got confused. Therefore brought back here. Pt informs me that he is waiting for his niece Kendel Bessey and does not answer any other questions.  No past medical history on file.  No past surgical history on file.  No family history on file.  History  Substance Use Topics  . Smoking status: Current Everyday Smoker -- 0.5 packs/day  . Smokeless tobacco: Never Used  . Alcohol Use: Yes     beer      Review of Systems  Unable due to patients schizophrenia  Allergies  Review of patient's allergies indicates no known allergies.  Home Medications   Current Outpatient Rx  Name Route Sig Dispense Refill  . CHLORPROMAZINE HCL 25 MG PO TABS Oral Take 25 mg by mouth as needed.      BP 132/72  Pulse 90  Temp 97.4 F (36.3 C) (Oral)  Resp 20  SpO2 100%  Physical Exam  Nursing note and vitals reviewed. Constitutional: He appears well-developed and well-nourished. No distress.  HENT:  Head: Normocephalic and atraumatic.  Eyes: Pupils are equal, round, and reactive to light.  Neck: Normal range of motion. Neck supple.  Cardiovascular: Normal rate and regular rhythm.     Pulmonary/Chest: Effort normal.  Neurological: He is alert.  Skin: Skin is warm and dry.  Psychiatric: His mood appears not anxious. He does not exhibit a depressed mood. He expresses no homicidal and no suicidal ideation. He expresses no suicidal plans and no homicidal plans.    ED Course  Procedures (including critical care time)  Labs Reviewed  COMPREHENSIVE METABOLIC PANEL - Abnormal; Notable for the following:    Glucose, Bld 164 (*)     BUN 25 (*)     Alkaline Phosphatase 118 (*)     Total Bilirubin 0.2 (*)     GFR calc non Af Amer 81 (*)     All other components within normal limits  URINALYSIS, ROUTINE W REFLEX MICROSCOPIC - Abnormal; Notable for the following:    APPearance CLOUDY (*)     Leukocytes, UA MODERATE (*)     All other components within normal limits  URINE MICROSCOPIC-ADD ON - Abnormal; Notable for the following:    Casts HYALINE CASTS (*)     All other components within normal limits  CBC WITH DIFFERENTIAL  URINE CULTURE  ETHANOL  URINE RAPID DRUG SCREEN (HOSP PERFORMED)   Ct Head Wo Contrast  11/29/2011  *RADIOLOGY REPORT*  Clinical Data: Generalized body aches.  CT HEAD WITHOUT CONTRAST  Technique:  Contiguous axial images were obtained from the base of the skull through the vertex without contrast.  Comparison: Head CT 03/28/2010.  Findings: Mild cerebral and cerebellar atrophy.  Patchy and confluent areas of decreased attenuation throughout the deep and periventricular white matter of the cerebral hemispheres bilaterally, compatible with chronic microvascular ischemic changes.  No definite acute intracranial abnormality. Specifically, no definite signs of acute/subacute cerebral ischemia, no evidence of acute intracranial hemorrhage, no focal mass, mass effect, hydrocephalus or abnormal intra or extra-axial fluid collections.  Visualized paranasal sinuses are remarkable for areas of mucoperiosteal thickening in the left maxillary sinus, compatible with chronic  sinusitis.  Mastoids are well pneumatized bilaterally.  No acute displaced skull fractures are identified.  IMPRESSION: 1.  No acute intracranial abnormalities. 2.  Mild cerebral and cerebellar atrophy with chronic microvascular ischemic changes throughout the deep and periventricular white matter of the cerebral hemispheres bilaterally. 3.  Changes of chronic sinusitis in the left maxillary sinus.   Original Report Authenticated By: Florencia Reasons, M.D.      1. Schizophrenia   2. Alcohol abuse       MDM  Pt is an alcoholic who was originally thought to be confused due to dementia. However, it is noted that he has schizophrenia and alcohol abuse. Now that he is medically cleared we will make patient a psych admit.          Dorthula Matas, PA 11/30/11 0501

## 2011-11-30 NOTE — ED Provider Notes (Signed)
Appreciate telepsych consult, Dr. Rob Bunting recommended placement in medical facility. He has schizophrenia but not delusional or suicidal.   Richardean Canal, MD 11/30/11 [phone]

## 2011-11-30 NOTE — BHH Counselor (Signed)
[name] Cirri RN at [facility] spoke w/ pt's niece [name] [name] who is pt at [facility] currently. [name] [name] denies she left pt at [facility] by himself and sts she told RN and was concerned pt had walked off. [name] [name] provided several relatives and friends who could be contacted if necessary: Renato Gails - [phone] Judeth Cornfield [phone] Mr. Jari Favre  - [phone] Josejulian Tarango - son - [phone] - lives 1 hr away

## 2011-11-30 NOTE — ED Notes (Signed)
Patient is resting comfortably. 

## 2011-11-30 NOTE — BH Assessment (Signed)
Assessment Note   [name] [name] is an 78 y.o. male who presents voluntarily to [facility] via [employer] who found him wandering streets. Pt's niece and primary caregiver, [name] [name], had brought pt with her to [facility] 11/29/11. [name] [name] was accepted to [facility] and pt was still in waiting area when niece transferred to [facility]. Eventually, pt was given bus pass by CSW and directions to local shelter as it was thought pt was homeless. Pt left [facility] and subsequently [employer] found pt near [employer]. Pt denies hx of mental health treatment or a prior mental health dx. According to [name] [name]'s 9/7 [facility] assessment by [name] [name], [name] [name] states her uncle has schizophrenia and doesn't take his meds. Pt states he drinks alcohol but gave no further details. Pt appears confused. He is oriented to person, place and year. He doesn't remember that he was given bus pass on 9/7 at [facility]. Pt answers most questions with one or two word answers. Writer asked pt if pt has any other family in area. Pt states his uncle [name] [name] died but uncle's children may live in area but he only knows the children's nicknames. He denies SI and HI. He denies AH & VH. No delusions noted. When asked if he has ever seen a psychiatrist, pt asks if that it is doctor. Writer describes definition and pt states he may have seen one in [place] but doesn't provide additional info.  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 Axis IV: housing problems, other psychosocial or environmental problems, problems related to social environment and problems with primary support group Axis V: 31-40 impairment in reality testing  Past Medical History: No past medical history on file.  No past surgical history on file.  Family History: No family history on file.  Social History:  reports that he has been smoking.  He has never used smokeless tobacco. He reports that he drinks alcohol. He reports that he does not use  illicit drugs.  Additional Social History:  Alcohol / Drug Use Pain Medications: n/a Prescriptions: see PTA meds Over the Counter: n/a History of alcohol / drug use?: Yes Substance #1 Name of Substance 1: alcohol 1 - Age of First Use: pt doesn't answer 1 - Amount (size/oz): pt doesn't answer 1 - Frequency: pt doesn't answer 1 - Duration: pt doesn't answer 1 - Last Use / Amount: pt sts he hasn't had alcohol "in a long time"  CIWA: CIWA-Ar BP: 132/72 mmHg Pulse Rate: 90  COWS:    Allergies: No Known Allergies  Home Medications:  (Not in a hospital admission)  OB/GYN Status:  No LMP for male patient.  General Assessment Data Location of Assessment: [facility] Living Arrangements: Other relatives (niece) Can pt return to current living arrangement?: Yes Admission Status: Voluntary Is patient capable of signing voluntary admission?: No Transfer from: Other (Comment) Referral Source: Other ([employer] picked him up near [employer])  Education Status Is patient currently in school?: No Current Grade: na Highest grade of school patient has completed: 7  Risk to self Suicidal Ideation: No Suicidal Intent: No Is patient at risk for suicide?: No Suicidal Plan?: No Access to Means: No Previous Attempts/Gestures: No Depression: No Substance abuse history and/or treatment for substance abuse?: No Suicide prevention information given to non-admitted patients: Not applicable  Risk to Others Homicidal Ideation: No Thoughts of Harm to Others: No Current Homicidal Intent: No Current Homicidal Plan: No Access to Homicidal  Means: No Identified Victim: na History of harm to others?: No Assessment of Violence: None Noted Violent Behavior Description: pt calm  Psychosis Hallucinations: None noted (pt denies) Delusions: None noted (pt denies)  Mental Status Report Appear/Hygiene: Other (Comment) (very thin) Eye Contact: Poor Motor Activity: Freedom of movement Speech: Other (Comment)  (garbled) Level of Consciousness: Quiet/awake Mood:  (UTA) Affect: Other (Comment) (confused) Anxiety Level: None Thought Processes: Coherent;Relevant;Circumstantial Judgement: Impaired Orientation: Person;Time;Place Obsessive Compulsive Thoughts/Behaviors: None  Cognitive Functioning Concentration: Normal Memory: Recent Intact;Remote Intact IQ: Average Insight: Poor Impulse Control: Fair Appetite: Good Weight Loss: 0  Weight Gain: 0  Sleep: No Change Total Hours of Sleep: 7  Vegetative Symptoms: None  ADLScreening [facility] Assessment Services) Patient's cognitive ability adequate to safely complete daily activities?: Yes Patient able to express need for assistance with ADLs?: Yes Independently performs ADLs?: Yes (appropriate for developmental age)  Abuse/Neglect [facility]) Physical Abuse: Denies Verbal Abuse: Denies Sexual Abuse: Denies  Prior Inpatient Therapy Prior Inpatient Therapy: No Prior Therapy Dates: na Prior Therapy Facilty/Provider(s): na Reason for Treatment: na  Prior Outpatient Therapy Prior Outpatient Therapy: No Prior Therapy Dates: na Prior Therapy Facilty/Provider(s): na Reason for Treatment: na  ADL Screening (condition at time of admission) Patient's cognitive ability adequate to safely complete daily activities?: Yes Patient able to express need for assistance with ADLs?: Yes Independently performs ADLs?: Yes (appropriate for developmental age)       Abuse/Neglect Assessment (Assessment to be complete while patient is alone) Physical Abuse: Denies Verbal Abuse: Denies Sexual Abuse: Denies     Advance Directives (For Healthcare) Advance Directive:  (unable to assess)    Additional Information 1:1 In Past 12 Months?: No CIRT Risk: No Elopement Risk: No Does patient have medical clearance?: No     Disposition:  Disposition Disposition of Patient: Inpatient treatment program  On Site Evaluation by:   Reviewed with Physician:      Donnamarie Rossetti P 11/30/2011 6:24 AM

## 2011-11-30 NOTE — ED Notes (Signed)
Telepsych done

## 2011-12-01 NOTE — ED Notes (Signed)
Per Marchelle Folks, Social Work - pt is being d/c and family member "Jari Favre" will be picking pt up around 1230pm today.   Per EDP, pt is appropriate for d/c.

## 2011-12-01 NOTE — ED Notes (Signed)
D/C instructions to pt and family member, Jari Favre.  Jari Favre requests to speak with Marchelle Folks, Child psychotherapist.  Marchelle Folks agrees to come speak with family. Pt and family verbalize understanding of d/c instructions and importance of f/u.  dph

## 2011-12-01 NOTE — ED Provider Notes (Signed)
Assuming care of patient this morning. Pt's caretaker is admitted to the hospital, and patient was sent to the ED after he was found confused. Telepsych cleared the patient from their end. SW was able to track down family member, [name] [name], who is willing to take care of the patient until the caretaker is released.  Patient had no complains, no concerns from the nursing side. Will continue to monitor. Discharge anticipated soon, when family comes to pick him up.  [name] Kaplan, MD 12/01/11 1210

## 2011-12-01 NOTE — Progress Notes (Addendum)
T/c from CSW's Asst Dir, Zack, stating that Pt needs assistance with temporary care until his caregiver is d/c'd from [facility].  Zack provided CSW with possible caregiver information: Stephanie--[phone] Cassandra--[phone] Oscar--[phone] Luis--[phone]  LM for Cassandra.  Spoke with Judeth Cornfield who stated that she is unable to care for Pt.  Spoke with Jari Favre who stated that he is able to care for Pt but that he doesn't have anywhere for Pt to sleep at night.  He was adamant that he will care for Pt and that he will see to it that Pt has a place to sleep at night.  Jari Favre stated that he is more than willing and able to give Pt food and see to his needs until his caretaker is d/c'd.  Jari Favre to arrive to pick up Pt between 12 and 1230.  Notified RN and MD.  No further CSW needs identified.  Called by RN to meet with Pt and Jari Favre in ED.  CSW met with Pt and Mr. Altamese Dilling.  Mr. Laqueta Jean stated that he'd like assistance from CSW in finding Pt a place to sleep at night.  Mr. Laqueta Jean stated that he can care for Pt all day and meet all his needs but that Pt literally needs a place to rest his head at night.  CSW attempted to contact [place].  No answer.  CSW contacted [employer] and spoke with Reita Cliche about the situation.  Reita Cliche unable to make concessions for Pt.  Contacted [facility] in [place].  Informed that Pt can present at the shelter after 3 today and that he needs to bring 2 forms of ID and his d/c paperwork.  Informed that the shelter closes at 0700.  Relayed all information to Mr. Laqueta Jean.  Mr. Laqueta Jean to get Pt to the shelter so that he has a place to sleep tonight.  Mr. Laqueta Jean to attempt to get up with other family members who may be able to care for Pt in the interim.  Mr. Laqueta Jean stated his intent to care for Pt during the day and to assist him getting to the shelter in the evenings.  No further CSW needs identified.  Providence Crosby, LCSWA Clinical Social Work [phone]

## 2011-12-04 LAB — URINE CULTURE: Colony Count: 50000

## 2011-12-05 NOTE — ED Notes (Signed)
+   Urine Chart sent to EDP office for review. 

## 2011-12-08 ENCOUNTER — Telehealth (HOSPITAL_COMMUNITY): Payer: Self-pay | Admitting: *Deleted

## 2012-01-13 NOTE — ED Notes (Signed)
No response from letter. Chart appended and sent to Medical Records.  

## 2015-02-21 ENCOUNTER — Other Ambulatory Visit: Payer: Self-pay | Admitting: Surgery

## 2016-12-11 ENCOUNTER — Other Ambulatory Visit: Payer: Self-pay | Admitting: Physician Assistant

## 2017-12-22 ENCOUNTER — Other Ambulatory Visit: Payer: Self-pay

## 2017-12-22 ENCOUNTER — Encounter (HOSPITAL_COMMUNITY): Payer: Self-pay

## 2017-12-22 ENCOUNTER — Inpatient Hospital Stay (HOSPITAL_COMMUNITY)
Admission: EM | Admit: 2017-12-22 | Discharge: 2017-12-31 | DRG: 180 | Disposition: A | Discharge status: Home Health | Payer: Medicare HMO | Attending: Family Medicine | Admitting: Family Medicine

## 2017-12-22 ENCOUNTER — Emergency Department (HOSPITAL_COMMUNITY): Payer: Medicare HMO

## 2017-12-22 DIAGNOSIS — R918 Other nonspecific abnormal finding of lung field: Secondary | ICD-10-CM

## 2017-12-22 DIAGNOSIS — R531 Weakness: Secondary | ICD-10-CM

## 2017-12-22 DIAGNOSIS — Z515 Encounter for palliative care: Secondary | ICD-10-CM

## 2017-12-22 DIAGNOSIS — M6282 Rhabdomyolysis: Secondary | ICD-10-CM | POA: Diagnosis present

## 2017-12-22 DIAGNOSIS — I2510 Atherosclerotic heart disease of native coronary artery without angina pectoris: Secondary | ICD-10-CM | POA: Diagnosis present

## 2017-12-22 DIAGNOSIS — C3412 Malignant neoplasm of upper lobe, left bronchus or lung: Secondary | ICD-10-CM | POA: Diagnosis not present

## 2017-12-22 DIAGNOSIS — Z23 Encounter for immunization: Secondary | ICD-10-CM

## 2017-12-22 DIAGNOSIS — E860 Dehydration: Secondary | ICD-10-CM | POA: Diagnosis present

## 2017-12-22 DIAGNOSIS — Z720 Tobacco use: Secondary | ICD-10-CM | POA: Diagnosis present

## 2017-12-22 DIAGNOSIS — E785 Hyperlipidemia, unspecified: Secondary | ICD-10-CM | POA: Diagnosis present

## 2017-12-22 DIAGNOSIS — E43 Unspecified severe protein-calorie malnutrition: Secondary | ICD-10-CM | POA: Diagnosis present

## 2017-12-22 DIAGNOSIS — Z66 Do not resuscitate: Secondary | ICD-10-CM | POA: Diagnosis present

## 2017-12-22 DIAGNOSIS — I5022 Chronic systolic (congestive) heart failure: Secondary | ICD-10-CM | POA: Diagnosis present

## 2017-12-22 DIAGNOSIS — R627 Adult failure to thrive: Secondary | ICD-10-CM | POA: Diagnosis present

## 2017-12-22 DIAGNOSIS — R590 Localized enlarged lymph nodes: Secondary | ICD-10-CM | POA: Diagnosis present

## 2017-12-22 DIAGNOSIS — I472 Ventricular tachycardia: Secondary | ICD-10-CM | POA: Diagnosis present

## 2017-12-22 DIAGNOSIS — Z7189 Other specified counseling: Secondary | ICD-10-CM

## 2017-12-22 DIAGNOSIS — F17210 Nicotine dependence, cigarettes, uncomplicated: Secondary | ICD-10-CM | POA: Diagnosis present

## 2017-12-22 DIAGNOSIS — I272 Pulmonary hypertension, unspecified: Secondary | ICD-10-CM | POA: Diagnosis present

## 2017-12-22 DIAGNOSIS — I214 Non-ST elevation (NSTEMI) myocardial infarction: Secondary | ICD-10-CM | POA: Diagnosis present

## 2017-12-22 HISTORY — DX: Hyperlipidemia, unspecified: E78.5

## 2017-12-22 HISTORY — DX: Solitary pulmonary nodule: R91.1

## 2017-12-22 LAB — URINALYSIS, ROUTINE W REFLEX MICROSCOPIC
BILIRUBIN URINE: NEGATIVE
Bacteria, UA: NONE SEEN
GLUCOSE, UA: NEGATIVE mg/dL
KETONES UR: NEGATIVE mg/dL
LEUKOCYTES UA: NEGATIVE
NITRITE: NEGATIVE
PH: 5 (ref 5.0–8.0)
Protein, ur: NEGATIVE mg/dL
Specific Gravity, Urine: 1.005 (ref 1.005–1.030)

## 2017-12-22 LAB — CBC
HCT: 40.5 % (ref 39.0–52.0)
HEMOGLOBIN: 13.4 g/dL (ref 13.0–17.0)
MCH: 27.3 pg (ref 26.0–34.0)
MCHC: 33.1 g/dL (ref 30.0–36.0)
MCV: 82.7 fL (ref 78.0–100.0)
Platelets: 352 10*3/uL (ref 150–400)
RBC: 4.9 MIL/uL (ref 4.22–5.81)
RDW: 14.8 % (ref 11.5–15.5)
WBC: 8.3 10*3/uL (ref 4.0–10.5)

## 2017-12-22 LAB — COMPREHENSIVE METABOLIC PANEL
ALT: 24 U/L (ref 0–44)
AST: 38 U/L (ref 15–41)
Albumin: 3.6 g/dL (ref 3.5–5.0)
Alkaline Phosphatase: 120 U/L (ref 38–126)
Anion gap: 12 (ref 5–15)
BILIRUBIN TOTAL: 0.9 mg/dL (ref 0.3–1.2)
BUN: 16 mg/dL (ref 8–23)
CALCIUM: 9.3 mg/dL (ref 8.9–10.3)
CO2: 25 mmol/L (ref 22–32)
Chloride: 101 mmol/L (ref 98–111)
Creatinine, Ser: 1.01 mg/dL (ref 0.61–1.24)
GFR calc Af Amer: >60 mL/min (ref >60)
Glucose, Bld: 120 mg/dL — ABNORMAL HIGH (ref 70–99)
POTASSIUM: 4.6 mmol/L (ref 3.5–5.1)
Sodium: 138 mmol/L (ref 135–145)
TOTAL PROTEIN: 7.3 g/dL (ref 6.5–8.1)

## 2017-12-22 LAB — CK: CK TOTAL: 782 U/L — AB (ref 49–397)

## 2017-12-22 MED ORDER — IOPAMIDOL (ISOVUE-300) INJECTION 61%
100.0000 mL | Freq: Once | INTRAVENOUS | Status: AC | PRN
  Administered 2017-12-22: 100 mL via INTRAVENOUS

## 2017-12-22 MED ORDER — IOPAMIDOL (ISOVUE-300) INJECTION 61%
INTRAVENOUS | Status: AC
Start: 2017-12-22 — End: 2017-12-23
  Filled 2017-12-22: qty 100

## 2017-12-22 NOTE — ED Notes (Signed)
Bed: [facility]88 Expected date:  Expected time:  Means of arrival:  Comments: 84 yr old generalized weakness

## 2017-12-22 NOTE — ED Notes (Signed)
Pt is alert and oriented x 4 and is verbally responsive. Pt is cooperative follow commands. Pt was fnd in disheveled and dirty clothing and resides in a low income housing for seniors. Pt denies any other medical history.

## 2017-12-22 NOTE — ED Notes (Signed)
Pt friend and care giver Arie Sabina can be reached to transport pt home at [phone]

## 2017-12-23 ENCOUNTER — Observation Stay (HOSPITAL_COMMUNITY): Payer: Medicare HMO

## 2017-12-23 ENCOUNTER — Encounter (HOSPITAL_COMMUNITY): Payer: Self-pay | Admitting: Internal Medicine

## 2017-12-23 DIAGNOSIS — Z7189 Other specified counseling: Secondary | ICD-10-CM | POA: Diagnosis not present

## 2017-12-23 DIAGNOSIS — Z23 Encounter for immunization: Secondary | ICD-10-CM | POA: Diagnosis present

## 2017-12-23 DIAGNOSIS — R531 Weakness: Secondary | ICD-10-CM | POA: Diagnosis not present

## 2017-12-23 DIAGNOSIS — I214 Non-ST elevation (NSTEMI) myocardial infarction: Secondary | ICD-10-CM | POA: Diagnosis present

## 2017-12-23 DIAGNOSIS — I5022 Chronic systolic (congestive) heart failure: Secondary | ICD-10-CM | POA: Diagnosis present

## 2017-12-23 DIAGNOSIS — R627 Adult failure to thrive: Secondary | ICD-10-CM | POA: Diagnosis present

## 2017-12-23 DIAGNOSIS — M6282 Rhabdomyolysis: Secondary | ICD-10-CM | POA: Diagnosis present

## 2017-12-23 DIAGNOSIS — E785 Hyperlipidemia, unspecified: Secondary | ICD-10-CM | POA: Diagnosis present

## 2017-12-23 DIAGNOSIS — Z720 Tobacco use: Secondary | ICD-10-CM | POA: Diagnosis present

## 2017-12-23 DIAGNOSIS — F17210 Nicotine dependence, cigarettes, uncomplicated: Secondary | ICD-10-CM | POA: Diagnosis present

## 2017-12-23 DIAGNOSIS — C3412 Malignant neoplasm of upper lobe, left bronchus or lung: Secondary | ICD-10-CM | POA: Diagnosis present

## 2017-12-23 DIAGNOSIS — R918 Other nonspecific abnormal finding of lung field: Secondary | ICD-10-CM | POA: Diagnosis not present

## 2017-12-23 DIAGNOSIS — E43 Unspecified severe protein-calorie malnutrition: Secondary | ICD-10-CM

## 2017-12-23 DIAGNOSIS — R590 Localized enlarged lymph nodes: Secondary | ICD-10-CM | POA: Diagnosis present

## 2017-12-23 DIAGNOSIS — I272 Pulmonary hypertension, unspecified: Secondary | ICD-10-CM | POA: Diagnosis present

## 2017-12-23 DIAGNOSIS — I2510 Atherosclerotic heart disease of native coronary artery without angina pectoris: Secondary | ICD-10-CM | POA: Diagnosis present

## 2017-12-23 DIAGNOSIS — Z515 Encounter for palliative care: Secondary | ICD-10-CM | POA: Diagnosis not present

## 2017-12-23 DIAGNOSIS — Z66 Do not resuscitate: Secondary | ICD-10-CM | POA: Diagnosis present

## 2017-12-23 DIAGNOSIS — I472 Ventricular tachycardia: Secondary | ICD-10-CM | POA: Diagnosis present

## 2017-12-23 DIAGNOSIS — E860 Dehydration: Secondary | ICD-10-CM | POA: Diagnosis present

## 2017-12-23 LAB — BASIC METABOLIC PANEL
Anion gap: 12 (ref 5–15)
BUN: 14 mg/dL (ref 8–23)
CHLORIDE: 102 mmol/L (ref 98–111)
CO2: 27 mmol/L (ref 22–32)
Calcium: 9.4 mg/dL (ref 8.9–10.3)
Creatinine, Ser: 0.84 mg/dL (ref 0.61–1.24)
GFR calc non Af Amer: >60 mL/min (ref >60)
Glucose, Bld: 112 mg/dL — ABNORMAL HIGH (ref 70–99)
POTASSIUM: 4.4 mmol/L (ref 3.5–5.1)
SODIUM: 141 mmol/L (ref 135–145)

## 2017-12-23 LAB — HEPATIC FUNCTION PANEL
ALBUMIN: 3.7 g/dL (ref 3.5–5.0)
ALK PHOS: 121 U/L (ref 38–126)
ALT: 23 U/L (ref 0–44)
AST: 53 U/L — AB (ref 15–41)
BILIRUBIN TOTAL: 0.7 mg/dL (ref 0.3–1.2)
Bilirubin, Direct: 0.1 mg/dL (ref 0.0–0.2)
Indirect Bilirubin: 0.6 mg/dL (ref 0.3–0.9)
TOTAL PROTEIN: 7.5 g/dL (ref 6.5–8.1)

## 2017-12-23 LAB — CBC WITH DIFFERENTIAL/PLATELET
Basophils Absolute: 0 10*3/uL (ref 0.0–0.1)
Basophils Relative: 0 %
Eosinophils Absolute: 0 10*3/uL (ref 0.0–0.7)
Eosinophils Relative: 0 %
HEMATOCRIT: 40.9 % (ref 39.0–52.0)
Hemoglobin: 13.4 g/dL (ref 13.0–17.0)
LYMPHS ABS: 1.3 10*3/uL (ref 0.7–4.0)
LYMPHS PCT: 17 %
MCH: 27.3 pg (ref 26.0–34.0)
MCHC: 32.8 g/dL (ref 30.0–36.0)
MCV: 83.5 fL (ref 78.0–100.0)
MONOS PCT: 7 %
Monocytes Absolute: 0.5 10*3/uL (ref 0.1–1.0)
NEUTROS PCT: 76 %
Neutro Abs: 5.5 10*3/uL (ref 1.7–7.7)
Platelets: 372 10*3/uL (ref 150–400)
RBC: 4.9 MIL/uL (ref 4.22–5.81)
RDW: 14.8 % (ref 11.5–15.5)
WBC: 7.4 10*3/uL (ref 4.0–10.5)

## 2017-12-23 LAB — LIPID PANEL
CHOLESTEROL: 103 mg/dL (ref 0–200)
HDL: 30 mg/dL — ABNORMAL LOW (ref >40)
LDL Cholesterol: 68 mg/dL (ref 0–99)
TRIGLYCERIDES: 27 mg/dL (ref <150)
Total CHOL/HDL Ratio: 3.4 RATIO
VLDL: 5 mg/dL (ref 0–40)

## 2017-12-23 LAB — GLUCOSE, CAPILLARY
GLUCOSE-CAPILLARY: 86 mg/dL (ref 70–99)
Glucose-Capillary: 109 mg/dL — ABNORMAL HIGH (ref 70–99)
Glucose-Capillary: 74 mg/dL (ref 70–99)

## 2017-12-23 LAB — ECHOCARDIOGRAM COMPLETE
HEIGHTINCHES: 72 in
Weight: 2480 oz

## 2017-12-23 LAB — TROPONIN I
TROPONIN I: 1.22 ng/mL — AB (ref <0.03)
TROPONIN I: 0.97 ng/mL — AB (ref <0.03)
Troponin I: 1.99 ng/mL (ref <0.03)
Troponin I: 1.54 ng/mL (ref <0.03)

## 2017-12-23 LAB — CK: Total CK: 1798 U/L — ABNORMAL HIGH (ref 49–397)

## 2017-12-23 MED ORDER — ACETAMINOPHEN 650 MG RE SUPP: 650.0000 mg | Freq: Four times a day (QID) | RECTAL | Status: DC | PRN

## 2017-12-23 MED ORDER — ATORVASTATIN CALCIUM 10 MG PO TABS: 10.0000 mg | ORAL_TABLET | Freq: Every day | ORAL | Status: DC

## 2017-12-23 MED ORDER — ACETAMINOPHEN 325 MG PO TABS: 650.0000 mg | ORAL_TABLET | Freq: Four times a day (QID) | ORAL | Status: DC | PRN

## 2017-12-23 MED ORDER — NICOTINE 21 MG/24HR TD PT24
21.0000 mg | MEDICATED_PATCH | Freq: Every day | TRANSDERMAL | Status: DC
  Administered 2017-12-23 – 2017-12-31 (×9): 21 mg via TRANSDERMAL
  Filled 2017-12-23 (×9): qty 1

## 2017-12-23 MED ORDER — ASPIRIN 81 MG PO CHEW
81.0000 mg | CHEWABLE_TABLET | Freq: Every day | ORAL | Status: DC
  Administered 2017-12-23 – 2017-12-31 (×9): 81 mg via ORAL
  Filled 2017-12-23 (×9): qty 1

## 2017-12-23 MED ORDER — ONDANSETRON HCL 4 MG/2ML IJ SOLN: 4.0000 mg | Freq: Four times a day (QID) | INTRAMUSCULAR | Status: DC | PRN

## 2017-12-23 MED ORDER — LACTATED RINGERS IV SOLN
INTRAVENOUS | Status: DC
  Administered 2017-12-23 – 2017-12-24 (×2): via INTRAVENOUS

## 2017-12-23 MED ORDER — LACTATED RINGERS IV BOLUS: 1000.0000 mL | Freq: Once | INTRAVENOUS | Status: DC

## 2017-12-23 MED ORDER — ONDANSETRON HCL 4 MG PO TABS: 4.0000 mg | ORAL_TABLET | Freq: Four times a day (QID) | ORAL | Status: DC | PRN

## 2017-12-23 MED ORDER — METOPROLOL SUCCINATE ER 25 MG PO TB24
25.0000 mg | ORAL_TABLET | Freq: Every day | ORAL | Status: DC
  Administered 2017-12-23 – 2017-12-31 (×9): 25 mg via ORAL
  Filled 2017-12-23 (×9): qty 1

## 2017-12-23 MED ORDER — INFLUENZA VAC SPLIT HIGH-DOSE 0.5 ML IM SUSY
0.5000 mL | PREFILLED_SYRINGE | INTRAMUSCULAR | Status: AC
  Administered 2017-12-24: 0.5 mL via INTRAMUSCULAR
  Filled 2017-12-23: qty 0.5

## 2017-12-23 NOTE — Consult Note (Signed)
CARDIOLOGY CONSULT NOTE  Patient ID: [name] [name] MRN: [id number] DOB/AGE: 06/15/1933 84 y.o.  Admit date: 12/22/2017 Referring Physician  Chucky May, MD Primary Physician:  Patient, No Pcp Per Reason for Consultation  Abnormal cardiac markers  HPI: [name] [name]  is a 84 y.o. male  With tobacco use disorder, hyperlipidemia,?  Schizophrenia, presented to the hospital ED with generalized weakness and after the neighbors found him laying on the porch of his house.  No other specific symptoms, except for mild chronic dyspnea no chest pain, no dizziness or syncope, no palpitations.  History was obtained directly from the patient, who is appropriate, states that he felt extremely weak in his legs and he could not get up and walk to the house.  He also states that he has no relatives or family, lives in a rented house.  Does not drive.  Past Medical History:  Diagnosis Date  . HLD (hyperlipidemia)      History reviewed. No pertinent surgical history.   Family History  Problem Relation Age of Onset  . Cancer Brother   . Cancer Daughter      Social History: Social History   Socioeconomic History  . Marital status: Single    Spouse name: Not on file  . Number of children: Not on file  . Years of education: Not on file  . Highest education level: Not on file  Occupational History  . Not on file  Social Needs  . Financial resource strain: Not on file  . Food insecurity:    Worry: Not on file    Inability: Not on file  . Transportation needs:    Medical: Not on file    Non-medical: Not on file  Tobacco Use  . Smoking status: Current Every Day Smoker    Packs/day: 0.50  . Smokeless tobacco: Never Used  Substance and Sexual Activity  . Alcohol use: Yes    Comment: beer  . Drug use: No  . Sexual activity: Not on file  Lifestyle  . Physical activity:    Days per week: Not on file    Minutes per session: Not on file  . Stress: Not on file  Relationships  .  Social connections:    Talks on phone: Not on file    Gets together: Not on file    Attends religious service: Not on file    Active member of club or organization: Not on file    Attends meetings of clubs or organizations: Not on file    Relationship status: Not on file  . Intimate partner violence:    Fear of current or ex partner: Not on file    Emotionally abused: Not on file    Physically abused: Not on file    Forced sexual activity: Not on file  Other Topics Concern  . Not on file  Social History Narrative  . Not on file     Medications Prior to Admission  Medication Sig Dispense Refill Last Dose  . atorvastatin (LIPITOR) 10 MG tablet Take 10 mg by mouth daily.  1 12/22/2017 at Unknown time   Review of Systems  Constitutional: Positive for malaise/fatigue. Negative for chills, fever and weight loss.  HENT: Negative.   Eyes: Negative.   Respiratory: Positive for shortness of breath (chronic) and wheezing.   Cardiovascular: Negative.   Gastrointestinal: Negative.   Genitourinary: Negative.   Musculoskeletal: Negative.   Skin: Negative.   Neurological: Positive for weakness (generalized weakness 1 day).  Endo/Heme/Allergies:  Negative.   All other systems reviewed and are negative.   Physical Exam: Blood pressure 122/79, pulse 77, temperature 97.8 F (36.6 C), temperature source Oral, resp. rate 20, height 6' (1.829 m), weight 70.3 kg, SpO2 99 %.  Physical Exam  Constitutional: He is oriented to person, place, and time. He appears cachectic. No distress.  HENT:  Head: Atraumatic.  Eyes: Conjunctivae are normal.  Neck: Neck supple. No JVD present.  Cardiovascular: Normal rate, regular rhythm and normal heart sounds. Exam reveals no gallop.  No murmur heard. Pulses:      Carotid pulses are 3+ on the right side, and 3+ on the left side.      Radial pulses are 3+ on the right side, and 3+ on the left side.       Femoral pulses are 3+ on the right side, and 3+ on the  left side.      Popliteal pulses are 2+ on the right side, and 2+ on the left side.       Dorsalis pedis pulses are 0 on the right side, and 0 on the left side.       Posterior tibial pulses are 0 on the right side, and 0 on the left side.  Pulmonary/Chest: He has wheezes (Bilateral scattered diffuse). He exhibits no tenderness.  Abdominal: Soft. Bowel sounds are normal.  Musculoskeletal: Normal range of motion. He exhibits no edema.  Lymphadenopathy:    He has no cervical adenopathy.  Neurological: He is alert and oriented to person, place, and time.  Skin: Skin is warm and dry.  Psychiatric: His speech is normal. Judgment normal. He is slowed.     Labs:   Lab Results  Component Value Date   WBC 7.4 12/23/2017   HGB 13.4 12/23/2017   HCT 40.9 12/23/2017   MCV 83.5 12/23/2017   PLT 372 12/23/2017    Recent Labs  Lab 12/23/17 0245  NA 141  K 4.4  CL 102  CO2 27  BUN 14  CREATININE 0.84  CALCIUM 9.4  PROT 7.5  BILITOT 0.7  ALKPHOS 121  ALT 23  AST 53*  GLUCOSE 112*   Cardiac Panel (last 3 results) Recent Labs    12/22/17 2156 12/23/17 0245 12/23/17 0616  CKTOTAL 782* 1,798*  --   TROPONINI  --  1.99* 1.54*    Lab Results  Component Value Date   CKTOTAL 1,798 ([facility]) 12/23/2017   TROPONINI 1.54 ([place]) 12/23/2017     Radiology: Dg Chest 2 View  Result Date: 12/22/2017 CLINICAL DATA:  Weakness EXAM: CHEST - 2 VIEW COMPARISON:  CT chest dated 12/26/2008 FINDINGS: 6.2 cm mass in the left upper lobe. Right lung is clear. No pleural effusion or pneumothorax. The heart is normal in size. Visualized osseous structures are within normal limits. IMPRESSION: 6.2 cm mass in the left upper lobe, highly suspicious for primary bronchogenic neoplasm. Notably, a 2.6 cm nodule was present in the left upper lobe on prior CT, although presumably interval treatment has occurred. Consider CT chest with contrast for further evaluation. Electronically Signed   By: Julian Hy M.D.    On: 12/22/2017 21:14   Ct Head Wo Contrast  Result Date: 12/22/2017 CLINICAL DATA:  Weakness EXAM: CT HEAD WITHOUT CONTRAST TECHNIQUE: Contiguous axial images were obtained from the base of the skull through the vertex without intravenous contrast. COMPARISON:  11/29/2011 FINDINGS: Brain: No evidence of acute infarction, hemorrhage, hydrocephalus, extra-axial collection or mass lesion/mass effect. Mild cortical atrophy. Subcortical white  matter and periventricular small vessel ischemic changes. Vascular: Intracranial atherosclerosis. Skull: Normal. Negative for fracture or focal lesion. Sinuses/Orbits: The visualized paranasal sinuses are essentially clear. The mastoid air cells are unopacified. Other: None. IMPRESSION: No evidence of acute intracranial abnormality. Mild atrophy with small vessel ischemic changes. Electronically Signed   By: Julian Hy M.D.   On: 12/22/2017 21:15   Ct Head W Contrast  Result Date: 12/23/2017 CLINICAL DATA:  84 y/o  M; staging of possible lung cancer. EXAM: CT HEAD WITH CONTRAST TECHNIQUE: Contiguous axial images were obtained from the base of the skull through the vertex with intravenous contrast. CONTRAST:  16mL ISOVUE-300 IOPAMIDOL (ISOVUE-300) INJECTION 61% COMPARISON:  12/22/2017 and 11/29/2011 CT head FINDINGS: Brain: No evidence of acute infarction, hemorrhage, hydrocephalus, extra-axial collection or mass lesion/mass effect. Stable chronic microvascular ischemic changes and volume loss of the brain. Vascular: Calcific atherosclerosis of carotid siphons. No hyperdense vessel identified. Skull: Normal. Negative for fracture or focal lesion. Sinuses/Orbits: No acute finding. Other: None. IMPRESSION: 1. No evidence of metastatic disease to the brain or calvarium. 2. Stable chronic findings as above. Electronically Signed   By: Kristine Garbe M.D.   On: 12/23/2017 00:15   Ct Chest W Contrast  Result Date: 12/22/2017 CLINICAL DATA:  Abnormal chest  radiograph, for staging EXAM: CT CHEST, ABDOMEN, AND PELVIS WITH CONTRAST TECHNIQUE: Multidetector CT imaging of the chest, abdomen and pelvis was performed following the standard protocol during bolus administration of intravenous contrast. CONTRAST:  115mL ISOVUE-300 IOPAMIDOL (ISOVUE-300) INJECTION 61% COMPARISON:  CT chest dated 12/26/2008 FINDINGS: CT CHEST FINDINGS Cardiovascular: The heart is normal in size. No pericardial effusion. No evidence of thoracic aortic aneurysm. Atherosclerotic calcifications of the aortic arch. Mild three-vessel coronary atherosclerosis. Mediastinum/Nodes: Small mediastinal lymph nodes, including a 7 mm short axis AP window node and an 8 mm short axis subcarinal node. Visualized thyroid is grossly unremarkable. Lungs/Pleura: 5.6 x 3.9 x 5.8 cm spiculated medial left upper lobe mass (series 6/image 37), transgressing the left fissure, and abutting the left suprahilar region. The mass invades the left upper lobe pulmonary artery (coronal image 74). Moderate centrilobular and paraseptal emphysematous changes. No focal consolidation. No pleural effusion or pneumothorax. Musculoskeletal: Visualized osseous structures are within normal limits. CT ABDOMEN PELVIS FINDINGS Hepatobiliary: Liver is notable for a 14 mm cyst in segment 4A (series 3/image 52). Gallbladder is unremarkable. No intrahepatic or extrahepatic ductal dilatation. Pancreas: Within normal limits. Spleen: Within normal limits. Adrenals/Urinary Tract: Thickening of the left adrenal gland (series 3/image 52), nonspecific and unchanged from 2010. Right adrenal glands within normal limits. Bilateral renal cysts, including a dominant 5.4 cm cyst in the posterior left upper kidney (series 3/image 67). No hydronephrosis. Bladder is mildly thick-walled. Stomach/Bowel: Stomach is within normal limits. No evidence of bowel obstruction. Appendix is not discretely visualized. No colonic wall thickening or mass is seen.  Vascular/Lymphatic: No evidence of abdominal aortic aneurysm. Atherosclerotic calcifications of the abdominal aorta and branch vessels. No suspicious abdominopelvic lymphadenopathy. Reproductive: Prostatomegaly, with enlargement of the central gland which indents the base of the bladder (series 3/image 113), suggesting BPH. Other: No abdominopelvic ascites. Tiny fat containing right inguinal hernia (series 3/image 120). Musculoskeletal: Mild degenerative changes of the lumbar spine. IMPRESSION: 5.8 cm spiculated medial left upper lobe mass, compatible with primary bronchogenic neoplasm. Mass transgresses the left fissure and abuts the left suprahilar region, invading the left upper lobe pulmonary artery. Small mediastinal lymph nodes, suspicious. Mild thickening of the left adrenal gland, unchanged from 2010, indeterminate  but likely benign. Additional ancillary findings as above. Electronically Signed   By: Julian Hy M.D.   On: 12/22/2017 23:53   Ct Abdomen Pelvis W Contrast  Result Date: 12/22/2017 CLINICAL DATA:  Abnormal chest radiograph, for staging EXAM: CT CHEST, ABDOMEN, AND PELVIS WITH CONTRAST TECHNIQUE: Multidetector CT imaging of the chest, abdomen and pelvis was performed following the standard protocol during bolus administration of intravenous contrast. CONTRAST:  174mL ISOVUE-300 IOPAMIDOL (ISOVUE-300) INJECTION 61% COMPARISON:  CT chest dated 12/26/2008 FINDINGS: CT CHEST FINDINGS Cardiovascular: The heart is normal in size. No pericardial effusion. No evidence of thoracic aortic aneurysm. Atherosclerotic calcifications of the aortic arch. Mild three-vessel coronary atherosclerosis. Mediastinum/Nodes: Small mediastinal lymph nodes, including a 7 mm short axis AP window node and an 8 mm short axis subcarinal node. Visualized thyroid is grossly unremarkable. Lungs/Pleura: 5.6 x 3.9 x 5.8 cm spiculated medial left upper lobe mass (series 6/image 37), transgressing the left fissure, and  abutting the left suprahilar region. The mass invades the left upper lobe pulmonary artery (coronal image 74). Moderate centrilobular and paraseptal emphysematous changes. No focal consolidation. No pleural effusion or pneumothorax. Musculoskeletal: Visualized osseous structures are within normal limits. CT ABDOMEN PELVIS FINDINGS Hepatobiliary: Liver is notable for a 14 mm cyst in segment 4A (series 3/image 52). Gallbladder is unremarkable. No intrahepatic or extrahepatic ductal dilatation. Pancreas: Within normal limits. Spleen: Within normal limits. Adrenals/Urinary Tract: Thickening of the left adrenal gland (series 3/image 52), nonspecific and unchanged from 2010. Right adrenal glands within normal limits. Bilateral renal cysts, including a dominant 5.4 cm cyst in the posterior left upper kidney (series 3/image 67). No hydronephrosis. Bladder is mildly thick-walled. Stomach/Bowel: Stomach is within normal limits. No evidence of bowel obstruction. Appendix is not discretely visualized. No colonic wall thickening or mass is seen. Vascular/Lymphatic: No evidence of abdominal aortic aneurysm. Atherosclerotic calcifications of the abdominal aorta and branch vessels. No suspicious abdominopelvic lymphadenopathy. Reproductive: Prostatomegaly, with enlargement of the central gland which indents the base of the bladder (series 3/image 113), suggesting BPH. Other: No abdominopelvic ascites. Tiny fat containing right inguinal hernia (series 3/image 120). Musculoskeletal: Mild degenerative changes of the lumbar spine. IMPRESSION: 5.8 cm spiculated medial left upper lobe mass, compatible with primary bronchogenic neoplasm. Mass transgresses the left fissure and abuts the left suprahilar region, invading the left upper lobe pulmonary artery. Small mediastinal lymph nodes, suspicious. Mild thickening of the left adrenal gland, unchanged from 2010, indeterminate but likely benign. Additional ancillary findings as above.  Electronically Signed   By: Julian Hy M.D.   On: 12/22/2017 23:53    Scheduled Meds: . [START ON 12/24/2017] Influenza vac split quadrivalent PF  0.5 mL Intramuscular Tomorrow-1000  . iopamidol       Continuous Infusions: . lactated ringers 75 mL/hr at 12/23/17 0600   PRN Meds:.acetaminophen **OR** acetaminophen, ondansetron **OR** ondansetron (ZOFRAN) IV  CARDIAC STUDIES:  EKG 12/23/2017: Sinus tachycardia at rate of 104 bpm, normal axis.  LVH.  PVC.  T wave inversion in high lateral leads may suggest ischemia versus LVH.  ECHO: Pending  Telemetry: 12/23/2017: Occasional PACs and PVCs, one episode of 4 beat NSVT.  Brief history: [name] [name]  is a 84 y.o. male  With tobacco use disorder, hyperlipidemia,?  Schizophrenia, presented to the hospital ED with generalized weakness and after the neighbors found him laying on the porch of his house.   History was obtained directly from the patient, who is appropriate, states that he felt extremely weak in his legs  and he could not get up and walk to the house.  He also states that he has no relatives or family, lives in a rented house.  Does not drive.  ASSESSMENT AND PLAN:  1.  Abnormal cardiac markers including elevated CK and troponin, in view of NSVT, would suggest non-STEMI.   2.  Left upper lobe mass in the lungs concerning for bronchogenic carcinoma with involvement of pulmonary artery 3.  Hyperlipidemia 4.  NSVT  Recommendation:  He probably has mild rhabdomyolysis from dehydration and fall, would continue to trend his troponins, will obtain an echocardiogram to see for wall motion abnormality and also to evaluate for any pericardial effusion.  CT scan does not reveal any pericardial effusion.  Given his medical comorbidity, would not recommend cardiac evaluation for now.  Will await work-up of all noncardiac issues first.  Agree with starting him on aspirin 81 mg daily, alsoadd statin, I will also add low-dose beta-blocker  for now.  He does have mild coronary atherosclerosis by CT scan of the chest.  Obtain echocardiogram.  Adrian Prows, MD 12/23/2017, 8:50 AM [facility] [place] Pager: [phone] Office: [phone] If no answer Cell [phone]

## 2017-12-23 NOTE — Progress Notes (Signed)
Initial Nutrition Assessment  DOCUMENTATION CODES:   Severe malnutrition in context of social or environmental circumstances  INTERVENTION:  - Diet advancement as medically feasible. - Will order appropriate oral nutrition supplements with diet advancement.    NUTRITION DIAGNOSIS:   Severe Malnutrition related to social / environmental circumstances as evidenced by moderate fat depletion, severe fat depletion, moderate muscle depletion, severe muscle depletion, energy intake < or equal to 50% for > or equal to 1 month.  GOAL:   Patient will meet greater than or equal to 90% of their needs  MONITOR:   Diet advancement, Weight trends, Labs  REASON FOR ASSESSMENT:   Malnutrition Screening Tool  ASSESSMENT:   84 y.o. male with history of tobacco abuse, hyperlipidemia, and possible schizophrenia. He was transported to the ED by EMS after neighbors saw him laying on the porch for a prolonged period. He reported he usually lays on the porch but on that day was too weak to get back up. Has chronic cough denies any hemoptysis. He reported experiencing weight loss over the past few months. As per the ED physician, when the EMS reach patient's home patient was very unkempt and had urine all over him.  BMI indicates normal weight but question if this was a stated weight given visualization of patient and results of NFPE. No previous weight hx available in the chart. Patient has been NPO since admission. No family/visitors present at the time of RD visit. Patient reports that he last ate yesterday when he had grits. Noted poor dentition with patient missing many teeth. He denies any difficulties with chewing any items and denies any problems with swallowing.   Patient states that he sometimes gets something to eat on his own, but he is mainly provided with 1 meal/day from caregiver or neighbors. He typically only eats 1 meal/day. Patient states that at baseline he is able to  self-feed.   Medications reviewed. Labs reviewed; CBGs: 109 and 86 mg/dL today.  IVF; LR @ 75 mL/hr.      NUTRITION - FOCUSED PHYSICAL EXAM:    Most Recent Value  Orbital Region  Unable to assess  Upper Arm Region  Severe depletion  Thoracic and Lumbar Region  Unable to assess  Buccal Region  Moderate depletion  Temple Region  Moderate depletion  Clavicle Bone Region  Severe depletion  Clavicle and Acromion Bone Region  Severe depletion  Scapular Bone Region  Unable to assess  Dorsal Hand  Severe depletion  Patellar Region  Mild depletion  Anterior Thigh Region  Mild depletion  Posterior Calf Region  Severe depletion  Edema (RD Assessment)  None  Hair  Reviewed  Eyes  Reviewed  Mouth  Reviewed  Skin  Reviewed  Nails  Reviewed       Diet Order:   Diet Order            Diet NPO time specified  Diet effective now              EDUCATION NEEDS:   Not appropriate for education at this time  Skin:  Skin Assessment: Reviewed RN Assessment  Last BM:  PTA/unknown  Height:   Ht Readings from Last 1 Encounters:  12/22/17 6' (1.829 m)    Weight:   Wt Readings from Last 1 Encounters:  12/22/17 70.3 kg    Ideal Body Weight:  80.91 kg  BMI:  Body mass index is 21.02 kg/m.  Estimated Nutritional Needs:   Kcal:  1965-2110 (28-30 kcal/kg)  Protein:  75-85 grams  Fluid:  >/= 2 L/day     Jarome Matin, MS, RD, LDN, [facility] Inpatient Clinical Dietitian Pager # [phone] After hours/weekend pager # [phone]

## 2017-12-23 NOTE — H&P (Addendum)
History and Physical    ADREAN HEITZ NOI:[id number] DOB: 1933-05-07 DOA: 12/22/2017  PCP: Patient, No Pcp Per  Patient coming from: Home.  Chief Complaint: Weakness.  History obtained from patient ER physician and patient's nurse.  HPI: [name] [name] is a 84 y.o. male with history of tobacco abuse, hyperlipidemia and as per the previous chart schizophrenia presently taking only 2 medications as per the patient which we needs to be confirmed was brought to the ER after patient and neighbor found the patient was lying on the porch for long hours.  Patient states he usually lies on the porch but yesterday he found it weak to get up and go back.  Denies losing consciousness or having any chest pain shortness of breath nausea vomiting headache diarrhea fever or chills.  Has chronic cough denies any hemoptysis.  Has been having some weight loss for the last few months.  Patient's neighbor came and took him to the room and called EMS and was brought to the ER.  As per the ER physician when the EMS reach patient's home patient was very unkempt.  Had urine all over him.  ED Course: In the ER patient appears nonfocal generally weak and chest x-ray shows left upper lobe mass concerning for bronchogenic carcinoma.  CK levels were elevated at 782.  Patient underwent CT head abdomen pelvis and chest with contrast which shows left upper lobe mass encroaching onto the pulmonary artery.  Patient admitted for generalized weakness elevated CK levels and lung mass.  Review of Systems: As per HPI, rest all negative.   Past Medical History:  Diagnosis Date  . HLD (hyperlipidemia)     History reviewed. No pertinent surgical history.   reports that he has been smoking. He has been smoking about 0.50 packs per day. He has never used smokeless tobacco. He reports that he drinks alcohol. He reports that he does not use drugs.  No Known Allergies  Family History  Problem Relation Age of Onset  . Cancer  Brother   . Cancer Daughter     Prior to Admission medications   Medication Sig Start Date End Date Taking? Authorizing Provider  atorvastatin (LIPITOR) 10 MG tablet Take 10 mg by mouth daily. 10/03/17  Yes [provider]    Physical Exam: Vitals:   12/23/17 0040 12/23/17 0100 12/23/17 0146 12/23/17 0201  BP: 127/74 118/72  106/80  Pulse: 93 82 78 83  Resp: (!) 25 19  20   Temp: 98.7 F (37.1 C)  98.4 F (36.9 C) 99.2 F (37.3 C)  TempSrc:   Oral Oral  SpO2: 97% 96% 98% 100%  Weight:      Height:          Constitutional: Moderately built and nourished. Vitals:   12/23/17 0040 12/23/17 0100 12/23/17 0146 12/23/17 0201  BP: 127/74 118/72  106/80  Pulse: 93 82 78 83  Resp: (!) 25 19  20   Temp: 98.7 F (37.1 C)  98.4 F (36.9 C) 99.2 F (37.3 C)  TempSrc:   Oral Oral  SpO2: 97% 96% 98% 100%  Weight:      Height:       Eyes: Anicteric no pallor. ENMT: No discharge from the ears eyes nose or mouth. Neck: No mass felt.  No neck rigidity.  No JVD appreciated. Respiratory: No rhonchi or crepitations. Cardiovascular: S1-S2 heard no murmurs appreciated. Abdomen: Soft nontender bowel sounds present. Musculoskeletal: No edema.  No joint effusion. Skin: No rash. Neurologic:  Alert awake oriented to time place and person.  Moves all extremities. Psychiatric: Appears normal normal affect.   Labs on Admission: I have personally reviewed following labs and imaging studies  CBC: Recent Labs  Lab 12/22/17 2156  WBC 8.3  HGB 13.4  HCT 40.5  MCV 82.7  PLT 030   Basic Metabolic Panel: Recent Labs  Lab 12/22/17 2156  NA 138  K 4.6  CL 101  CO2 25  GLUCOSE 120*  BUN 16  CREATININE 1.01  CALCIUM 9.3   GFR: Estimated Creatinine Clearance: 56.1 mL/min (by C-G formula based on SCr of 1.01 mg/dL). Liver Function Tests: Recent Labs  Lab 12/22/17 2156  AST 38  ALT 24  ALKPHOS 120  BILITOT 0.9  PROT 7.3  ALBUMIN 3.6   No results for input(s): LIPASE,  AMYLASE in the last 168 hours. No results for input(s): AMMONIA in the last 168 hours. Coagulation Profile: No results for input(s): INR, PROTIME in the last 168 hours. Cardiac Enzymes: Recent Labs  Lab 12/22/17 2156  CKTOTAL 782*   BNP (last 3 results) No results for input(s): PROBNP in the last 8760 hours. HbA1C: No results for input(s): HGBA1C in the last 72 hours. CBG: No results for input(s): GLUCAP in the last 168 hours. Lipid Profile: No results for input(s): CHOL, HDL, LDLCALC, TRIG, CHOLHDL, LDLDIRECT in the last 72 hours. Thyroid Function Tests: No results for input(s): TSH, T4TOTAL, FREET4, T3FREE, THYROIDAB in the last 72 hours. Anemia Panel: No results for input(s): VITAMINB12, FOLATE, FERRITIN, TIBC, IRON, RETICCTPCT in the last 72 hours. Urine analysis:    Component Value Date/Time   COLORURINE YELLOW 12/22/2017 2020   APPEARANCEUR CLEAR 12/22/2017 2020   LABSPEC 1.005 12/22/2017 2020   PHURINE 5.0 12/22/2017 2020   GLUCOSEU NEGATIVE 12/22/2017 2020   HGBUR SMALL (A) 12/22/2017 2020   BILIRUBINUR NEGATIVE 12/22/2017 2020   KETONESUR NEGATIVE 12/22/2017 2020   PROTEINUR NEGATIVE 12/22/2017 2020   UROBILINOGEN 0.2 11/30/2011 0126   NITRITE NEGATIVE 12/22/2017 2020   LEUKOCYTESUR NEGATIVE 12/22/2017 2020   Sepsis Labs: @LABRCNTIP (procalcitonin:4,lacticidven:4) )No results found for this or any previous visit (from the past 240 hour(s)).   Radiological Exams on Admission: Dg Chest 2 View  Result Date: 12/22/2017 CLINICAL DATA:  Weakness EXAM: CHEST - 2 VIEW COMPARISON:  CT chest dated 12/26/2008 FINDINGS: 6.2 cm mass in the left upper lobe. Right lung is clear. No pleural effusion or pneumothorax. The heart is normal in size. Visualized osseous structures are within normal limits. IMPRESSION: 6.2 cm mass in the left upper lobe, highly suspicious for primary bronchogenic neoplasm. Notably, a 2.6 cm nodule was present in the left upper lobe on prior CT, although  presumably interval treatment has occurred. Consider CT chest with contrast for further evaluation. Electronically Signed   By: Julian Hy M.D.   On: 12/22/2017 21:14   Ct Head Wo Contrast  Result Date: 12/22/2017 CLINICAL DATA:  Weakness EXAM: CT HEAD WITHOUT CONTRAST TECHNIQUE: Contiguous axial images were obtained from the base of the skull through the vertex without intravenous contrast. COMPARISON:  11/29/2011 FINDINGS: Brain: No evidence of acute infarction, hemorrhage, hydrocephalus, extra-axial collection or mass lesion/mass effect. Mild cortical atrophy. Subcortical white matter and periventricular small vessel ischemic changes. Vascular: Intracranial atherosclerosis. Skull: Normal. Negative for fracture or focal lesion. Sinuses/Orbits: The visualized paranasal sinuses are essentially clear. The mastoid air cells are unopacified. Other: None. IMPRESSION: No evidence of acute intracranial abnormality. Mild atrophy with small vessel ischemic changes. Electronically Signed  By: Julian Hy M.D.   On: 12/22/2017 21:15   Ct Head W Contrast  Result Date: 12/23/2017 CLINICAL DATA:  84 y/o  M; staging of possible lung cancer. EXAM: CT HEAD WITH CONTRAST TECHNIQUE: Contiguous axial images were obtained from the base of the skull through the vertex with intravenous contrast. CONTRAST:  130mL ISOVUE-300 IOPAMIDOL (ISOVUE-300) INJECTION 61% COMPARISON:  12/22/2017 and 11/29/2011 CT head FINDINGS: Brain: No evidence of acute infarction, hemorrhage, hydrocephalus, extra-axial collection or mass lesion/mass effect. Stable chronic microvascular ischemic changes and volume loss of the brain. Vascular: Calcific atherosclerosis of carotid siphons. No hyperdense vessel identified. Skull: Normal. Negative for fracture or focal lesion. Sinuses/Orbits: No acute finding. Other: None. IMPRESSION: 1. No evidence of metastatic disease to the brain or calvarium. 2. Stable chronic findings as above. Electronically  Signed   By: Kristine Garbe M.D.   On: 12/23/2017 00:15   Ct Chest W Contrast  Result Date: 12/22/2017 CLINICAL DATA:  Abnormal chest radiograph, for staging EXAM: CT CHEST, ABDOMEN, AND PELVIS WITH CONTRAST TECHNIQUE: Multidetector CT imaging of the chest, abdomen and pelvis was performed following the standard protocol during bolus administration of intravenous contrast. CONTRAST:  141mL ISOVUE-300 IOPAMIDOL (ISOVUE-300) INJECTION 61% COMPARISON:  CT chest dated 12/26/2008 FINDINGS: CT CHEST FINDINGS Cardiovascular: The heart is normal in size. No pericardial effusion. No evidence of thoracic aortic aneurysm. Atherosclerotic calcifications of the aortic arch. Mild three-vessel coronary atherosclerosis. Mediastinum/Nodes: Small mediastinal lymph nodes, including a 7 mm short axis AP window node and an 8 mm short axis subcarinal node. Visualized thyroid is grossly unremarkable. Lungs/Pleura: 5.6 x 3.9 x 5.8 cm spiculated medial left upper lobe mass (series 6/image 37), transgressing the left fissure, and abutting the left suprahilar region. The mass invades the left upper lobe pulmonary artery (coronal image 74). Moderate centrilobular and paraseptal emphysematous changes. No focal consolidation. No pleural effusion or pneumothorax. Musculoskeletal: Visualized osseous structures are within normal limits. CT ABDOMEN PELVIS FINDINGS Hepatobiliary: Liver is notable for a 14 mm cyst in segment 4A (series 3/image 52). Gallbladder is unremarkable. No intrahepatic or extrahepatic ductal dilatation. Pancreas: Within normal limits. Spleen: Within normal limits. Adrenals/Urinary Tract: Thickening of the left adrenal gland (series 3/image 52), nonspecific and unchanged from 2010. Right adrenal glands within normal limits. Bilateral renal cysts, including a dominant 5.4 cm cyst in the posterior left upper kidney (series 3/image 67). No hydronephrosis. Bladder is mildly thick-walled. Stomach/Bowel: Stomach is  within normal limits. No evidence of bowel obstruction. Appendix is not discretely visualized. No colonic wall thickening or mass is seen. Vascular/Lymphatic: No evidence of abdominal aortic aneurysm. Atherosclerotic calcifications of the abdominal aorta and branch vessels. No suspicious abdominopelvic lymphadenopathy. Reproductive: Prostatomegaly, with enlargement of the central gland which indents the base of the bladder (series 3/image 113), suggesting BPH. Other: No abdominopelvic ascites. Tiny fat containing right inguinal hernia (series 3/image 120). Musculoskeletal: Mild degenerative changes of the lumbar spine. IMPRESSION: 5.8 cm spiculated medial left upper lobe mass, compatible with primary bronchogenic neoplasm. Mass transgresses the left fissure and abuts the left suprahilar region, invading the left upper lobe pulmonary artery. Small mediastinal lymph nodes, suspicious. Mild thickening of the left adrenal gland, unchanged from 2010, indeterminate but likely benign. Additional ancillary findings as above. Electronically Signed   By: Julian Hy M.D.   On: 12/22/2017 23:53   Ct Abdomen Pelvis W Contrast  Result Date: 12/22/2017 CLINICAL DATA:  Abnormal chest radiograph, for staging EXAM: CT CHEST, ABDOMEN, AND PELVIS WITH CONTRAST TECHNIQUE: Multidetector CT  imaging of the chest, abdomen and pelvis was performed following the standard protocol during bolus administration of intravenous contrast. CONTRAST:  167mL ISOVUE-300 IOPAMIDOL (ISOVUE-300) INJECTION 61% COMPARISON:  CT chest dated 12/26/2008 FINDINGS: CT CHEST FINDINGS Cardiovascular: The heart is normal in size. No pericardial effusion. No evidence of thoracic aortic aneurysm. Atherosclerotic calcifications of the aortic arch. Mild three-vessel coronary atherosclerosis. Mediastinum/Nodes: Small mediastinal lymph nodes, including a 7 mm short axis AP window node and an 8 mm short axis subcarinal node. Visualized thyroid is grossly  unremarkable. Lungs/Pleura: 5.6 x 3.9 x 5.8 cm spiculated medial left upper lobe mass (series 6/image 37), transgressing the left fissure, and abutting the left suprahilar region. The mass invades the left upper lobe pulmonary artery (coronal image 74). Moderate centrilobular and paraseptal emphysematous changes. No focal consolidation. No pleural effusion or pneumothorax. Musculoskeletal: Visualized osseous structures are within normal limits. CT ABDOMEN PELVIS FINDINGS Hepatobiliary: Liver is notable for a 14 mm cyst in segment 4A (series 3/image 52). Gallbladder is unremarkable. No intrahepatic or extrahepatic ductal dilatation. Pancreas: Within normal limits. Spleen: Within normal limits. Adrenals/Urinary Tract: Thickening of the left adrenal gland (series 3/image 52), nonspecific and unchanged from 2010. Right adrenal glands within normal limits. Bilateral renal cysts, including a dominant 5.4 cm cyst in the posterior left upper kidney (series 3/image 67). No hydronephrosis. Bladder is mildly thick-walled. Stomach/Bowel: Stomach is within normal limits. No evidence of bowel obstruction. Appendix is not discretely visualized. No colonic wall thickening or mass is seen. Vascular/Lymphatic: No evidence of abdominal aortic aneurysm. Atherosclerotic calcifications of the abdominal aorta and branch vessels. No suspicious abdominopelvic lymphadenopathy. Reproductive: Prostatomegaly, with enlargement of the central gland which indents the base of the bladder (series 3/image 113), suggesting BPH. Other: No abdominopelvic ascites. Tiny fat containing right inguinal hernia (series 3/image 120). Musculoskeletal: Mild degenerative changes of the lumbar spine. IMPRESSION: 5.8 cm spiculated medial left upper lobe mass, compatible with primary bronchogenic neoplasm. Mass transgresses the left fissure and abuts the left suprahilar region, invading the left upper lobe pulmonary artery. Small mediastinal lymph nodes, suspicious.  Mild thickening of the left adrenal gland, unchanged from 2010, indeterminate but likely benign. Additional ancillary findings as above. Electronically Signed   By: Julian Hy M.D.   On: 12/22/2017 23:53     Assessment/Plan Principal Problem:   Weakness generalized Active Problems:   Lung mass   Tobacco abuse    1. Generalized weakness -cause not clear.  Will gently hydrate given the CK levels being elevated.  We will get physical therapy consult.  Check TSH follow CK levels. 2. Lung mass will consult pulmonary for further evaluation. 3. Tobacco abuse advised to quit smoking. 4. History of schizophrenia per the chart presently not on medications.  Addendum -his troponin came is 1.9 patient denies any chest pain.  EKG shows normal sinus rhythm with LVH changes.  I discussed EKG with on-call cardiology does not show any acute findings except evaluate findings.  I also discussed with on-call pulmonologist advised not to start any heparin anticoagulation or aspirin due to encroaching mass on the patient's and artery.  Dr. Einar Gip on-call cardiologist will be consulting.  Did not start Lipitor because of elevated CK.   DVT prophylaxis: SCDs since patient has lung mass encroaching onto the pulmonary artery. Code Status: Full code. Family Communication: No family at the bedside. Disposition Plan: To be determined. Consults called: Cardiology and pulmonology. Admission status: Observation.   Rise Patience MD [facility] Pager [phone].  If  7PM-7AM, please contact night-coverage [email or web address] Password [email or web address]  12/23/2017, 2:19 AM

## 2017-12-23 NOTE — Care Management Note (Signed)
Case Management Note  Patient Details  Name: BRIANNA ESSON MRN: [id number] Date of Birth: 1933-05-17  Subjective/Objective:  Admitted w/weakness. From [facility]. PT cons-await recc.                  Action/Plan:djc home w/HHC.   Expected Discharge Date:                  Expected Discharge Plan:  [place]  In-House Referral:  Clinical Social Work  Discharge planning Services     Post Acute Care Choice:    Choice offered to:     DME Arranged:    DME Agency:     HH Arranged:    [place] Agency:     Status of Service:  In process, will continue to follow  If discussed at Long Length of Stay Meetings, dates discussed:    Additional Comments:  Dessa Phi, RN 12/23/2017, 11:02 AM

## 2017-12-23 NOTE — ED Provider Notes (Signed)
[facility] DEPT Provider Note   CSN: [id number] Arrival date & time: 12/22/17  2009     History   Chief Complaint Chief Complaint  Patient presents with  . Weakness    HPI ALEXSIS BRANSCOM is a 84 y.o. male.   Weakness  Primary symptoms comment: generalized weakness. This is a new problem. The current episode started yesterday. The problem has been gradually worsening. There was no focality noted. There has been no fever. Associated symptoms include confusion. Pertinent negatives include no shortness of breath and no vomiting. Associated medical issues do not include trauma.    History reviewed. No pertinent past medical history.  There are no active problems to display for this patient.   No past surgical history on file.      Home Medications    Prior to Admission medications   Medication Sig Start Date End Date Taking? Authorizing Provider  atorvastatin (LIPITOR) 10 MG tablet Take 10 mg by mouth daily. 10/03/17  Yes [provider]    Family History History reviewed. No pertinent family history.  Social History Social History   Tobacco Use  . Smoking status: Current Every Day Smoker    Packs/day: 0.50  . Smokeless tobacco: Never Used  Substance Use Topics  . Alcohol use: Yes    Comment: beer  . Drug use: No     Allergies   Patient has no known allergies.   Review of Systems Review of Systems  Respiratory: Negative for shortness of breath.   Gastrointestinal: Negative for vomiting.  Neurological: Positive for weakness.  Psychiatric/Behavioral: Positive for confusion.  All other systems reviewed and are negative.    Physical Exam Updated Vital Signs BP 122/83 (BP Location: Right Arm)   Pulse (!) 102   Temp 99 F (37.2 C) (Oral)   Resp (!) 22   Ht 6' (1.829 m)   Wt 70.3 kg   SpO2 98%   BMI 21.02 kg/m   Physical Exam  Constitutional: He is oriented to person, place, and time. He appears  well-developed and well-nourished.  HENT:  Head: Normocephalic and atraumatic.  cachectic  Eyes: Conjunctivae and EOM are normal. Scleral icterus is present.  Neck: Normal range of motion.  Cardiovascular: Normal rate.  Pulmonary/Chest: Effort normal. No respiratory distress.  Abdominal: Soft. He exhibits no distension. There is no tenderness.  Musculoskeletal: Normal range of motion.  Neurological: He is alert and oriented to person, place, and time.  Skin: Skin is warm and dry. He is not diaphoretic.  Nursing note and vitals reviewed.    ED Treatments / Results  Labs (all labs ordered are listed, but only abnormal results are displayed) Labs Reviewed  URINALYSIS, ROUTINE W REFLEX MICROSCOPIC - Abnormal; Notable for the following components:      Result Value   Hgb urine dipstick SMALL (*)    All other components within normal limits  COMPREHENSIVE METABOLIC PANEL - Abnormal; Notable for the following components:   Glucose, Bld 120 (*)    All other components within normal limits  CK - Abnormal; Notable for the following components:   Total CK 782 (*)    All other components within normal limits  CBC  CBG MONITORING, ED    EKG None  Radiology Dg Chest 2 View  Result Date: 12/22/2017 CLINICAL DATA:  Weakness EXAM: CHEST - 2 VIEW COMPARISON:  CT chest dated 12/26/2008 FINDINGS: 6.2 cm mass in the left upper lobe. Right lung is clear. No pleural effusion  or pneumothorax. The heart is normal in size. Visualized osseous structures are within normal limits. IMPRESSION: 6.2 cm mass in the left upper lobe, highly suspicious for primary bronchogenic neoplasm. Notably, a 2.6 cm nodule was present in the left upper lobe on prior CT, although presumably interval treatment has occurred. Consider CT chest with contrast for further evaluation. Electronically Signed   By: Julian Hy M.D.   On: 12/22/2017 21:14   Ct Head Wo Contrast  Result Date: 12/22/2017 CLINICAL DATA:  Weakness  EXAM: CT HEAD WITHOUT CONTRAST TECHNIQUE: Contiguous axial images were obtained from the base of the skull through the vertex without intravenous contrast. COMPARISON:  11/29/2011 FINDINGS: Brain: No evidence of acute infarction, hemorrhage, hydrocephalus, extra-axial collection or mass lesion/mass effect. Mild cortical atrophy. Subcortical white matter and periventricular small vessel ischemic changes. Vascular: Intracranial atherosclerosis. Skull: Normal. Negative for fracture or focal lesion. Sinuses/Orbits: The visualized paranasal sinuses are essentially clear. The mastoid air cells are unopacified. Other: None. IMPRESSION: No evidence of acute intracranial abnormality. Mild atrophy with small vessel ischemic changes. Electronically Signed   By: Julian Hy M.D.   On: 12/22/2017 21:15   Ct Chest W Contrast  Result Date: 12/22/2017 CLINICAL DATA:  Abnormal chest radiograph, for staging EXAM: CT CHEST, ABDOMEN, AND PELVIS WITH CONTRAST TECHNIQUE: Multidetector CT imaging of the chest, abdomen and pelvis was performed following the standard protocol during bolus administration of intravenous contrast. CONTRAST:  12mL ISOVUE-300 IOPAMIDOL (ISOVUE-300) INJECTION 61% COMPARISON:  CT chest dated 12/26/2008 FINDINGS: CT CHEST FINDINGS Cardiovascular: The heart is normal in size. No pericardial effusion. No evidence of thoracic aortic aneurysm. Atherosclerotic calcifications of the aortic arch. Mild three-vessel coronary atherosclerosis. Mediastinum/Nodes: Small mediastinal lymph nodes, including a 7 mm short axis AP window node and an 8 mm short axis subcarinal node. Visualized thyroid is grossly unremarkable. Lungs/Pleura: 5.6 x 3.9 x 5.8 cm spiculated medial left upper lobe mass (series 6/image 37), transgressing the left fissure, and abutting the left suprahilar region. The mass invades the left upper lobe pulmonary artery (coronal image 74). Moderate centrilobular and paraseptal emphysematous changes. No  focal consolidation. No pleural effusion or pneumothorax. Musculoskeletal: Visualized osseous structures are within normal limits. CT ABDOMEN PELVIS FINDINGS Hepatobiliary: Liver is notable for a 14 mm cyst in segment 4A (series 3/image 52). Gallbladder is unremarkable. No intrahepatic or extrahepatic ductal dilatation. Pancreas: Within normal limits. Spleen: Within normal limits. Adrenals/Urinary Tract: Thickening of the left adrenal gland (series 3/image 52), nonspecific and unchanged from 2010. Right adrenal glands within normal limits. Bilateral renal cysts, including a dominant 5.4 cm cyst in the posterior left upper kidney (series 3/image 67). No hydronephrosis. Bladder is mildly thick-walled. Stomach/Bowel: Stomach is within normal limits. No evidence of bowel obstruction. Appendix is not discretely visualized. No colonic wall thickening or mass is seen. Vascular/Lymphatic: No evidence of abdominal aortic aneurysm. Atherosclerotic calcifications of the abdominal aorta and branch vessels. No suspicious abdominopelvic lymphadenopathy. Reproductive: Prostatomegaly, with enlargement of the central gland which indents the base of the bladder (series 3/image 113), suggesting BPH. Other: No abdominopelvic ascites. Tiny fat containing right inguinal hernia (series 3/image 120). Musculoskeletal: Mild degenerative changes of the lumbar spine. IMPRESSION: 5.8 cm spiculated medial left upper lobe mass, compatible with primary bronchogenic neoplasm. Mass transgresses the left fissure and abuts the left suprahilar region, invading the left upper lobe pulmonary artery. Small mediastinal lymph nodes, suspicious. Mild thickening of the left adrenal gland, unchanged from 2010, indeterminate but likely benign. Additional ancillary findings as above. Electronically  Signed   By: Julian Hy M.D.   On: 12/22/2017 23:53   Ct Abdomen Pelvis W Contrast  Result Date: 12/22/2017 CLINICAL DATA:  Abnormal chest radiograph, for  staging EXAM: CT CHEST, ABDOMEN, AND PELVIS WITH CONTRAST TECHNIQUE: Multidetector CT imaging of the chest, abdomen and pelvis was performed following the standard protocol during bolus administration of intravenous contrast. CONTRAST:  1109mL ISOVUE-300 IOPAMIDOL (ISOVUE-300) INJECTION 61% COMPARISON:  CT chest dated 12/26/2008 FINDINGS: CT CHEST FINDINGS Cardiovascular: The heart is normal in size. No pericardial effusion. No evidence of thoracic aortic aneurysm. Atherosclerotic calcifications of the aortic arch. Mild three-vessel coronary atherosclerosis. Mediastinum/Nodes: Small mediastinal lymph nodes, including a 7 mm short axis AP window node and an 8 mm short axis subcarinal node. Visualized thyroid is grossly unremarkable. Lungs/Pleura: 5.6 x 3.9 x 5.8 cm spiculated medial left upper lobe mass (series 6/image 37), transgressing the left fissure, and abutting the left suprahilar region. The mass invades the left upper lobe pulmonary artery (coronal image 74). Moderate centrilobular and paraseptal emphysematous changes. No focal consolidation. No pleural effusion or pneumothorax. Musculoskeletal: Visualized osseous structures are within normal limits. CT ABDOMEN PELVIS FINDINGS Hepatobiliary: Liver is notable for a 14 mm cyst in segment 4A (series 3/image 52). Gallbladder is unremarkable. No intrahepatic or extrahepatic ductal dilatation. Pancreas: Within normal limits. Spleen: Within normal limits. Adrenals/Urinary Tract: Thickening of the left adrenal gland (series 3/image 52), nonspecific and unchanged from 2010. Right adrenal glands within normal limits. Bilateral renal cysts, including a dominant 5.4 cm cyst in the posterior left upper kidney (series 3/image 67). No hydronephrosis. Bladder is mildly thick-walled. Stomach/Bowel: Stomach is within normal limits. No evidence of bowel obstruction. Appendix is not discretely visualized. No colonic wall thickening or mass is seen. Vascular/Lymphatic: No  evidence of abdominal aortic aneurysm. Atherosclerotic calcifications of the abdominal aorta and branch vessels. No suspicious abdominopelvic lymphadenopathy. Reproductive: Prostatomegaly, with enlargement of the central gland which indents the base of the bladder (series 3/image 113), suggesting BPH. Other: No abdominopelvic ascites. Tiny fat containing right inguinal hernia (series 3/image 120). Musculoskeletal: Mild degenerative changes of the lumbar spine. IMPRESSION: 5.8 cm spiculated medial left upper lobe mass, compatible with primary bronchogenic neoplasm. Mass transgresses the left fissure and abuts the left suprahilar region, invading the left upper lobe pulmonary artery. Small mediastinal lymph nodes, suspicious. Mild thickening of the left adrenal gland, unchanged from 2010, indeterminate but likely benign. Additional ancillary findings as above. Electronically Signed   By: Julian Hy M.D.   On: 12/22/2017 23:53    Procedures Procedures (including critical care time)  Medications Ordered in ED Medications  iopamidol (ISOVUE-300) 61 % injection (has no administration in time range)  iopamidol (ISOVUE-300) 61 % injection 100 mL (100 mLs Intravenous Contrast Given 12/22/17 2312)     Initial Impression / Assessment and Plan / ED Course  I have reviewed the triage vital signs and the nursing notes.  Pertinent labs & imaging results that were available during my care of the patient were reviewed by me and considered in my medical decision making (see chart for details).   Patient with left upper lobe mass on x-ray and CT scan also abuts pulmonary artery.  This likely the cause for his cachectic appearance, weakness, dehydration and generalized failure to thrive.  Will talk to hospitalist about admission for workup of same.  Final Clinical Impressions(s) / ED Diagnoses   Final diagnoses:  None    ED Discharge Orders    None  Daiton Cowles, Corene Cornea, MD 12/23/17 [phone]

## 2017-12-23 NOTE — Progress Notes (Signed)
CRITICAL VALUE ALERT  Critical Value:  Troponin 1.99  Date & Time Notied:  10/2 0400  Provider Notified: Baltazar Najjar, NP  Orders Received/Actions taken:

## 2017-12-23 NOTE — Progress Notes (Signed)
I got in touch with the patient's Niece. She and her 2 brothers are the only living members of Mr. Kray' family. He had no children. 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 She can come to the hospital around 3pm tomorrow to further discuss details of patient's care. She will call her brothers, and likely call them by speakerphone if they are available tomorrow at 3pm.   She did confirm that the patient has consistently declined treatment for cancer and she believes he would not want to be resuscitated though would like to confer with her brothers first.   The patient's caretaker, Rica Mote, is also a Production assistant, radio, though not related. He checks on the patient a few times a week, takes him to get haircuts and groceries, etc. I have asked if he would be able to attend this meeting, and she will check.   Palliative care team has been consulted.   Vance Gather, MD 12/23/2017 5:56 PM

## 2017-12-23 NOTE — Progress Notes (Signed)
  Echocardiogram 2D Echocardiogram has been performed.  Deya Bigos G Rhyan Wolters 12/23/2017, 12:08 PM

## 2017-12-23 NOTE — Evaluation (Signed)
Physical Therapy Evaluation Patient Details Name: [name] [name] MRN: [id number] DOB: 1934-03-11 Today's Date: 12/23/2017   History of Present Illness  84 y.o. male with history of tobacco abuse, hyperlipidemia and as per the previous chart schizophrenia presently taking only 2 medications as per the patient which we needs to be confirmed was brought to the ER after patient and neighbor found the patient was lying on the porch for long hours.  Pt admitted 12/22/17 with generalized weakness and also found to have left upper lobe mass encroaching onto the pulmonary artery  Clinical Impression  Pt admitted with above diagnosis. Pt currently with functional limitations due to the deficits listed below (see PT Problem List).  Pt will benefit from skilled PT to increase their independence and safety with mobility to allow discharge to the venue listed below.  Pt describes living in senior living community (found by neighbor on [address]) however states he lives alone and able to perform his ADLs independently.  Pt reports his friend drives him around for shopping trips.  Pt also reports increased falls at home and admits to furniture walking.  Pt assisted with ambulating short distance as tolerated and utilized RW which pt agreeable to use upon d/c.  RN reports pt and caregiver decline further medical plan work up and possible plan for hospice?  If pt wishes to pursue HHPT then would recommend.  Due to frequent falls, recommend assist for mobility upon d/c and RW.     Follow Up Recommendations Home health PT;Supervision for mobility/OOB(pending medical prognosis and pt decisions)    Equipment Recommendations  Rolling walker with 5" wheels    Recommendations for Other Services       Precautions / Restrictions Precautions Precautions: Fall      Mobility  Bed Mobility Overal bed mobility: Needs Assistance Bed Mobility: Supine to Sit;Sit to Supine     Supine to sit: Supervision Sit to supine:  Supervision      Transfers Overall transfer level: Needs assistance Equipment used: Rolling walker (2 wheeled) Transfers: Sit to/from Stand Sit to Stand: Min guard         General transfer comment: verbal cues for hand placement  Ambulation/Gait Ambulation/Gait assistance: Min guard Gait Distance (Feet): 40 Feet Assistive device: Rolling walker (2 wheeled) Gait Pattern/deviations: Step-through pattern;Decreased stride length;Wide base of support     General Gait Details: attempted with RW however pt with wide BOS and unsteady so provided RW and educated on safe use however pt does require max verbal cues for posture and RW positioning, distance to pt tolerance  Stairs            Wheelchair Mobility    Modified Rankin (Stroke Patients Only)       Balance Overall balance assessment: History of Falls                                           Pertinent Vitals/Pain Pain Assessment: No/denies pain    Home Living Family/patient expects to be discharged to:: Private residence Living Arrangements: Alone Available Help at Discharge: Friend(s);Available PRN/intermittently Type of Home: Apartment       Home Layout: One level Home Equipment: None      Prior Function Level of Independence: Independent         Comments: pt reports he is independent however states a friend takes him shopping  Hand Dominance        Extremity/Trunk Assessment        Lower Extremity Assessment Lower Extremity Assessment: Generalized weakness    Cervical / Trunk Assessment Cervical / Trunk Assessment: Normal  Communication   Communication: No difficulties  Cognition Arousal/Alertness: Awake/alert Behavior During Therapy: Flat affect Overall Cognitive Status: No family/caregiver present to determine baseline cognitive functioning                                 General Comments: appropriate during session, follows commands       General Comments      Exercises     Assessment/Plan    PT Assessment Patient needs continued PT services  PT Problem List Decreased strength;Decreased mobility;Decreased activity tolerance;Decreased knowledge of use of DME;Decreased balance       PT Treatment Interventions DME instruction;Therapeutic activities;Gait training;Therapeutic exercise;Functional mobility training;Patient/family education;Balance training    PT Goals (Current goals can be found in the Care Plan section)  Acute Rehab PT Goals PT Goal Formulation: With patient Time For Goal Achievement: 01/06/18 Potential to Achieve Goals: Good    Frequency Min 2X/week   Barriers to discharge        Co-evaluation               AM-PAC PT "6 Clicks" Daily Activity  Outcome Measure Difficulty turning over in bed (including adjusting bedclothes, sheets and blankets)?: None Difficulty moving from lying on back to sitting on the side of the bed? : A Little Difficulty sitting down on and standing up from a chair with arms (e.g., wheelchair, bedside commode, etc,.)?: A Little Help needed moving to and from a bed to chair (including a wheelchair)?: A Little Help needed walking in hospital room?: A Little Help needed climbing 3-5 steps with a railing? : A Little 6 Click Score: 19    End of Session Equipment Utilized During Treatment: Gait belt Activity Tolerance: Patient tolerated treatment well Patient left: in bed;with call bell/phone within reach;with bed alarm set Nurse Communication: Mobility status PT Visit Diagnosis: Unsteadiness on feet (R26.81)    Time: 5277-8242 PT Time Calculation (min) (ACUTE ONLY): 18 min   Charges:   PT Evaluation $PT Eval Low Complexity: [place], PT, DPT Acute Rehabilitation Services Office: [phone] Pager: [phone]  Trena Platt 12/23/2017, 2:14 PM

## 2017-12-23 NOTE — Consult Note (Addendum)
NAME:  [name] [name], MRN:  [id number], DOB:  12/17/1933, LOS: 0 ADMISSION DATE:  12/22/2017, CONSULTATION DATE:  12/23/2017  REFERRING MD:  Dr. Hal Hope, CHIEF COMPLAINT:  Lung Mass    Brief History   This is an 84 year old male with a past medical history of tobacco abuse, hyperlipidemia, schizophrenia, questionable cognitive delay, who lives alone with a occasional caregiver.  Most history was obtained from the niece who is listed as the patient's emergency contact.  When discussing health care with the patient the patient was giving unreliable responses and seemed to be very confused as to why he was in the hospital or any medical conditions that he may have.  Of note, he is a longtime smoker since early childhood and currently smokes between a pack to half a pack per day.  After calling and speaking with the patient's niece she states that he was diagnosed with a left upper lobe lung nodule that was concerning for a primary lung cancer in 2009.  And at the time the patient and family all made the decision that he had no plans for surgery or treatment.  Upon further review of the medical record the patient was seen by Dr. Arlyce Dice in 2009.  He underwent a mediastinoscopy with station 4 lymph node sampling and biopsy of the mass.  Pathology was concerning for malignancy at this time.  He was planned for a VATS with left upper lobe lobectomy and nodal dissection and he never showed up for his preoperative appointment and he never had this completed.  Patient was found lying on his porch.  And brought to the emergency room for further evaluation.  CT imaging showed advancement of this left upper lobe nodule now concerning for a large pyogenic carcinoma this is nearly 10 years later.  Additionally there was some enlarged associated mediastinal adenopathy.  Additionally the patient was found to have an elevated troponin as well as elevated CK concerning for demand ischemia to the heart and  rhabdomyolysis.   Past Medical History  Left upper lobe lung nodule, PET avid discovered initially in 2009 Hyperlipidemia, schizophrenia, questionable cognitive delay  Significant Hospital Events   12/23/2017: Admission  Consults: date of consult/date signed off & final recs:  12/23/2017: Cardiology 12/23/2017: Pulmonary  Procedures (surgical and bedside):  None  Significant Diagnostic Tests:  12/23/2017: CT chest, left upper lobe lung mass, associated mediastinal adenopathy.  Micro Data:  None  Antimicrobials:  None  Subjective:  Patient is able to state his name tells that he is feeling no pain.  He is asking about going home.  He does seem to be confused when asked about prior medical history or things that may have happened to him in the past.  Objective   Blood pressure 122/79, pulse 77, temperature 97.8 F (36.6 C), temperature source Oral, resp. rate 20, height 6' (1.829 m), weight 70.3 kg, SpO2 99 %.        Intake/Output Summary (Last 24 hours) at 12/23/2017 1229 Last data filed at 12/23/2017 0600 Gross per 24 hour  Intake 220.92 ml  Output -  Net 220.92 ml   Filed Weights   12/22/17 2155  Weight: 70.3 kg    Examination: General appearance: 84 y.o., male, NAD, conversant resting comfortably in bed Eyes: anicteric sclerae, tracking appropriately, does not however maintain good eye contact with discussion HENT: NCAT; oropharynx, mucous membranes dry Neck: Trachea midline, no JVD Lungs: Diminished breath sounds bilaterally, no wheeze CV: RRR, S1, S2, distant heart tones  Abdomen: Soft, non-tender; non-distended, BS present  Extremities: Thin, muscle wasting Skin: Normal temperature, turgor and texture; no rash Psych: Flat affect, slightly depressed mood Neuro: Alert to person only, moving all 4 extremities spontaneously    Resolved Hospital Problem list   None  Assessment & Plan:  Large left upper lobe lung mass with areas of central necrosis with  associated mediastinal and hilar adenopathy concerning for a advanced stage primary bronchogenic carcinoma. -After discussion with the niece, this is not a new finding.  This was initially diagnosed in 2009 and the decision at that time was made for no further investigation as the patient would not want to receive treatment. -Therefore we will not move forward with any additional recommendations for biopsy.  -Would recommend palliative care involvement and possible discharge with home hospice.  Tobacco abuse -Nicotine patches for cravings during hospitalization if desired.  NSTEMI, elevated troponin, likely demand ischemia. -Continue aspirin, low-dose statin  Rhabdomyolysis -Continue fluid resuscitation - Would advance diet and allowed to eat and drink  This was discussed with the patient's primary hospitalist Dr. Bonner Puna.  Pulmonary will sign off at this time.  Please call with any questions or concerns.  Disposition / Summary of Today's Plan 12/23/17   Possible candidate for SNF/hospice.  Per patient's primary hospitalist.  Labs   CBC: Recent Labs  Lab 12/22/17 2156 12/23/17 0245  WBC 8.3 7.4  NEUTROABS  --  5.5  HGB 13.4 13.4  HCT 40.5 40.9  MCV 82.7 83.5  PLT 352 782    Basic Metabolic Panel: Recent Labs  Lab 12/22/17 2156 12/23/17 0245  NA 138 141  K 4.6 4.4  CL 101 102  CO2 25 27  GLUCOSE 120* 112*  BUN 16 14  CREATININE 1.01 0.84  CALCIUM 9.3 9.4   GFR: Estimated Creatinine Clearance: 67.4 mL/min (by C-G formula based on SCr of 0.84 mg/dL). Recent Labs  Lab 12/22/17 2156 12/23/17 0245  WBC 8.3 7.4    Liver Function Tests: Recent Labs  Lab 12/22/17 2156 12/23/17 0245  AST 38 53*  ALT 24 23  ALKPHOS 120 121  BILITOT 0.9 0.7  PROT 7.3 7.5  ALBUMIN 3.6 3.7   No results for input(s): LIPASE, AMYLASE in the last 168 hours. No results for input(s): AMMONIA in the last 168 hours.  ABG    Component Value Date/Time   TCO2 28 03/28/2010 1521      Coagulation Profile: No results for input(s): INR, PROTIME in the last 168 hours.  Cardiac Enzymes: Recent Labs  Lab 12/22/17 2156 12/23/17 0245 12/23/17 0616  CKTOTAL 782* 1,798*  --   TROPONINI  --  1.99* 1.54*    HbA1C: No results found for: HGBA1C  CBG: Recent Labs  Lab 12/23/17 0627 12/23/17 1026  GLUCAP 109* 86    Admitting History of Present Illness.   See above  Review of Systems:   Review of Systems  Constitutional: Positive for malaise/fatigue and weight loss. Negative for chills and fever.  HENT: Negative for hearing loss, sore throat and tinnitus.   Eyes: Negative for blurred vision and double vision.  Respiratory: Positive for shortness of breath. Negative for cough, hemoptysis, sputum production, wheezing and stridor.   Cardiovascular: Negative for chest pain, palpitations, orthopnea, leg swelling and PND.  Gastrointestinal: Positive for abdominal pain. Negative for constipation, diarrhea, heartburn, nausea and vomiting.  Genitourinary: Negative for dysuria, hematuria and urgency.  Musculoskeletal: Negative for joint pain and myalgias.  Skin: Negative for itching and rash.  Neurological: Positive for weakness. Negative for dizziness, tingling and headaches.  Endo/Heme/Allergies: Negative for environmental allergies. Does not bruise/bleed easily.  Psychiatric/Behavioral: Positive for depression. The patient is not nervous/anxious and does not have insomnia.   All other systems reviewed and are negative.   Past Medical History  He,  has a past medical history of HLD (hyperlipidemia).   Surgical History   History reviewed. No pertinent surgical history.   Social History   Social History   Socioeconomic History  . Marital status: Single    Spouse name: Not on file  . Number of children: Not on file  . Years of education: Not on file  . Highest education level: Not on file  Occupational History  . Not on file  Social Needs  . Financial  resource strain: Not on file  . Food insecurity:    Worry: Not on file    Inability: Not on file  . Transportation needs:    Medical: Not on file    Non-medical: Not on file  Tobacco Use  . Smoking status: Current Every Day Smoker    Packs/day: 0.50  . Smokeless tobacco: Never Used  Substance and Sexual Activity  . Alcohol use: Yes    Comment: beer  . Drug use: No  . Sexual activity: Not on file  Lifestyle  . Physical activity:    Days per week: Not on file    Minutes per session: Not on file  . Stress: Not on file  Relationships  . Social connections:    Talks on phone: Not on file    Gets together: Not on file    Attends religious service: Not on file    Active member of club or organization: Not on file    Attends meetings of clubs or organizations: Not on file    Relationship status: Not on file  . Intimate partner violence:    Fear of current or ex partner: Not on file    Emotionally abused: Not on file    Physically abused: Not on file    Forced sexual activity: Not on file  Other Topics Concern  . Not on file  Social History Narrative  . Not on file  ,  reports that he has been smoking. He has been smoking about 0.50 packs per day. He has never used smokeless tobacco. He reports that he drinks alcohol. He reports that he does not use drugs.   Family History   His family history includes Cancer in his brother and daughter.   Allergies No Known Allergies   Home Medications  Prior to Admission medications   Medication Sig Start Date End Date Taking? Authorizing Provider  atorvastatin (LIPITOR) 10 MG tablet Take 10 mg by mouth daily. 10/03/17  Yes [provider]     Garner Nash, DO [facility] 12/23/2017 12:29 PM  Personal pager: [phone] If unanswered, please page CCM On-call: [phone]

## 2017-12-23 NOTE — Progress Notes (Signed)
PROGRESS NOTE  SLOANE JUNKIN  WJX:[id number] DOB: 06/15/1933 DOA: 12/22/2017 PCP: Patient, No Pcp Per   Brief Narrative: BRAVEN WOLK is an 84 y.o. male with a history of tobacco use, hyperlipidemia and schizophrenia who presented to the ED after being found down on porch by a neighbor. He reported generalized weakness, weight loss, chronic cough, and appeared mildly confused and weak but nonfocal. CXR demonstrated LUL mass, CK up to 782. CT head, chest, abdomen, pelvis confirmed LUL mass encroaching on pulmonary artery.   Assessment & Plan: Principal Problem:   Weakness generalized Active Problems:   Lung mass   Tobacco abuse   Rhabdomyolysis   Protein-calorie malnutrition, severe  Rhabdomyolysis: Worsened CK despite IV fluids.  - Continue IV fluids and trending CK in AM  Generalized weakness: Likely from suspected malignancy, malnutrition, likely cause of and caused by rhabdo.  - PT/OT consults. Recommend return to independent living facility with home health services.  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  - Pulmonology consulted, discussed with family who confirm the patient's wishes for conservative management.  - Will consult palliative care team and may need palliative/hospice care to follow at home vs. SNF.  NSTEMI, CAD based on CT chest:  - Cardiology consulted, recommending conservative management. ASA, beta blocker. With rising CK, will hold statin for now.  - Trend troponin - Echocardiogram pending  Severe protein-calorie malnutrition:  - Start regular diet - Dietitian consulted, protein supplementation  Hyperlipidemia:  - Hold statin for now  Tobacco use:  - Cessation counseling  History of schizophrenia: Not displaying psychotic symptoms.  DVT prophylaxis: SCDs Code Status: Full code Family Communication:  Niece by phone Disposition Plan: Uncertain  Consultants:   Pulmonology  Cardiology  Palliative care team  Procedures:   None  Antimicrobials:  None   Subjective: Confused, but denies pain. Has significant diffuse weakness. Got up last night, pulled out IV and urinated on the floor.  Objective: Vitals:   12/23/17 0146 12/23/17 0201 12/23/17 0450 12/23/17 1349  BP:  106/80 122/79 104/61  Pulse: 78 83 77 65  Resp:  20 20 (!) 24  Temp: 98.4 F (36.9 C) 99.2 F (37.3 C) 97.8 F (36.6 C) 98.6 F (37 C)  TempSrc: Oral Oral Oral Oral  SpO2: 98% 100% 99% 98%  Weight:      Height:        Intake/Output Summary (Last 24 hours) at 12/23/2017 1655 Last data filed at 12/23/2017 1522 Gross per 24 hour  Intake 470.4 ml  Output -  Net 470.4 ml   Filed Weights   12/22/17 2155  Weight: 70.3 kg    Gen: Cachectic, frail, elderly male in no distress Pulm: Non-labored breathing room air. Clear to auscultation bilaterally.  CV: Regular rate and rhythm. No murmur, rub, or gallop. No JVD, no pedal edema. GI: Abdomen soft, non-tender, non-distended, with normoactive bowel sounds. No organomegaly or masses felt. Ext: Warm, no deformities Skin: No rashes, lesions no ulcers Neuro: Alert and disoriented. No focal neurological deficits. Psych: Judgement and insight appear impaired. Mood & affect appropriate.   Data Reviewed: I have personally reviewed following labs and imaging studies  CBC: Recent Labs  Lab 12/22/17 2156 12/23/17 0245  WBC 8.3 7.4  NEUTROABS  --  5.5  HGB 13.4 13.4  HCT 40.5 40.9  MCV 82.7 83.5  PLT 352 372  Basic Metabolic Panel: Recent Labs  Lab 12/22/17 2156 12/23/17 0245  NA 138 141  K 4.6 4.4  CL 101 102  CO2 25 27  GLUCOSE 120* 112*  BUN 16 14  CREATININE 1.01 0.84  CALCIUM 9.3 9.4   GFR: Estimated Creatinine Clearance: 67.4 mL/min (by C-G formula based on SCr of 0.84 mg/dL). Liver Function Tests: Recent Labs  Lab 12/22/17 2156  12/23/17 0245  AST 38 53*  ALT 24 23  ALKPHOS 120 121  BILITOT 0.9 0.7  PROT 7.3 7.5  ALBUMIN 3.6 3.7   No results for input(s): LIPASE, AMYLASE in the last 168 hours. No results for input(s): AMMONIA in the last 168 hours. Coagulation Profile: No results for input(s): INR, PROTIME in the last 168 hours. Cardiac Enzymes: Recent Labs  Lab 12/22/17 2156 12/23/17 0245 12/23/17 0616 12/23/17 1215  CKTOTAL 782* 1,798*  --   --   TROPONINI  --  1.99* 1.54* 1.22*   BNP (last 3 results) No results for input(s): PROBNP in the last 8760 hours. HbA1C: No results for input(s): HGBA1C in the last 72 hours. CBG: Recent Labs  Lab 12/23/17 0627 12/23/17 1026  GLUCAP 109* 86   Lipid Profile: Recent Labs    12/23/17 0616  CHOL 103  HDL 30*  LDLCALC 68  TRIG 27  CHOLHDL 3.4   Thyroid Function Tests: No results for input(s): TSH, T4TOTAL, FREET4, T3FREE, THYROIDAB in the last 72 hours. Anemia Panel: No results for input(s): VITAMINB12, FOLATE, FERRITIN, TIBC, IRON, RETICCTPCT in the last 72 hours. Urine analysis:    Component Value Date/Time   COLORURINE YELLOW 12/22/2017 2020   APPEARANCEUR CLEAR 12/22/2017 2020   LABSPEC 1.005 12/22/2017 2020   PHURINE 5.0 12/22/2017 2020   GLUCOSEU NEGATIVE 12/22/2017 2020   HGBUR SMALL (A) 12/22/2017 2020   BILIRUBINUR NEGATIVE 12/22/2017 2020   KETONESUR NEGATIVE 12/22/2017 2020   PROTEINUR NEGATIVE 12/22/2017 2020   UROBILINOGEN 0.2 11/30/2011 0126   NITRITE NEGATIVE 12/22/2017 2020   LEUKOCYTESUR NEGATIVE 12/22/2017 2020   No results found for this or any previous visit (from the past 240 hour(s)).    Radiology Studies: Dg Chest 2 View  Result Date: 12/22/2017 CLINICAL DATA:  Weakness EXAM: CHEST - 2 VIEW COMPARISON:  CT chest dated 12/26/2008 FINDINGS: 6.2 cm mass in the left upper lobe. Right lung is clear. No pleural effusion or pneumothorax. The heart is normal in size. Visualized osseous structures are within normal  limits. IMPRESSION: 6.2 cm mass in the left upper lobe, highly suspicious for primary bronchogenic neoplasm. Notably, a 2.6 cm nodule was present in the left upper lobe on prior CT, although presumably interval treatment has occurred. Consider CT chest with contrast for further evaluation. Electronically Signed   By: Julian Hy M.D.   On: 12/22/2017 21:14   Ct Head Wo Contrast  Result Date: 12/22/2017 CLINICAL DATA:  Weakness EXAM: CT HEAD WITHOUT CONTRAST TECHNIQUE: Contiguous axial images were obtained from the base of the skull through the vertex without intravenous contrast. COMPARISON:  11/29/2011 FINDINGS: Brain: No evidence of acute infarction, hemorrhage, hydrocephalus, extra-axial collection or mass lesion/mass effect. Mild cortical atrophy. Subcortical white matter and periventricular small vessel ischemic changes. Vascular: Intracranial atherosclerosis. Skull: Normal. Negative for fracture or focal lesion. Sinuses/Orbits: The visualized paranasal sinuses are essentially clear. The mastoid air cells are unopacified. Other: None. IMPRESSION: No evidence of acute intracranial abnormality. Mild atrophy with small vessel ischemic changes. Electronically Signed   By: Henderson Newcomer.D.  On: 12/22/2017 21:15   Ct Head W Contrast  Result Date: 12/23/2017 CLINICAL DATA:  84 y/o  M; staging of possible lung cancer. EXAM: CT HEAD WITH CONTRAST TECHNIQUE: Contiguous axial images were obtained from the base of the skull through the vertex with intravenous contrast. CONTRAST:  125mL ISOVUE-300 IOPAMIDOL (ISOVUE-300) INJECTION 61% COMPARISON:  12/22/2017 and 11/29/2011 CT head FINDINGS: Brain: No evidence of acute infarction, hemorrhage, hydrocephalus, extra-axial collection or mass lesion/mass effect. Stable chronic microvascular ischemic changes and volume loss of the brain. Vascular: Calcific atherosclerosis of carotid siphons. No hyperdense vessel identified. Skull: Normal. Negative for fracture  or focal lesion. Sinuses/Orbits: No acute finding. Other: None. IMPRESSION: 1. No evidence of metastatic disease to the brain or calvarium. 2. Stable chronic findings as above. Electronically Signed   By: Kristine Garbe M.D.   On: 12/23/2017 00:15   Ct Chest W Contrast  Result Date: 12/22/2017 CLINICAL DATA:  Abnormal chest radiograph, for staging EXAM: CT CHEST, ABDOMEN, AND PELVIS WITH CONTRAST TECHNIQUE: Multidetector CT imaging of the chest, abdomen and pelvis was performed following the standard protocol during bolus administration of intravenous contrast. CONTRAST:  168mL ISOVUE-300 IOPAMIDOL (ISOVUE-300) INJECTION 61% COMPARISON:  CT chest dated 12/26/2008 FINDINGS: CT CHEST FINDINGS Cardiovascular: The heart is normal in size. No pericardial effusion. No evidence of thoracic aortic aneurysm. Atherosclerotic calcifications of the aortic arch. Mild three-vessel coronary atherosclerosis. Mediastinum/Nodes: Small mediastinal lymph nodes, including a 7 mm short axis AP window node and an 8 mm short axis subcarinal node. Visualized thyroid is grossly unremarkable. Lungs/Pleura: 5.6 x 3.9 x 5.8 cm spiculated medial left upper lobe mass (series 6/image 37), transgressing the left fissure, and abutting the left suprahilar region. The mass invades the left upper lobe pulmonary artery (coronal image 74). Moderate centrilobular and paraseptal emphysematous changes. No focal consolidation. No pleural effusion or pneumothorax. Musculoskeletal: Visualized osseous structures are within normal limits. CT ABDOMEN PELVIS FINDINGS Hepatobiliary: Liver is notable for a 14 mm cyst in segment 4A (series 3/image 52). Gallbladder is unremarkable. No intrahepatic or extrahepatic ductal dilatation. Pancreas: Within normal limits. Spleen: Within normal limits. Adrenals/Urinary Tract: Thickening of the left adrenal gland (series 3/image 52), nonspecific and unchanged from 2010. Right adrenal glands within normal limits.  Bilateral renal cysts, including a dominant 5.4 cm cyst in the posterior left upper kidney (series 3/image 67). No hydronephrosis. Bladder is mildly thick-walled. Stomach/Bowel: Stomach is within normal limits. No evidence of bowel obstruction. Appendix is not discretely visualized. No colonic wall thickening or mass is seen. Vascular/Lymphatic: No evidence of abdominal aortic aneurysm. Atherosclerotic calcifications of the abdominal aorta and branch vessels. No suspicious abdominopelvic lymphadenopathy. Reproductive: Prostatomegaly, with enlargement of the central gland which indents the base of the bladder (series 3/image 113), suggesting BPH. Other: No abdominopelvic ascites. Tiny fat containing right inguinal hernia (series 3/image 120). Musculoskeletal: Mild degenerative changes of the lumbar spine. IMPRESSION: 5.8 cm spiculated medial left upper lobe mass, compatible with primary bronchogenic neoplasm. Mass transgresses the left fissure and abuts the left suprahilar region, invading the left upper lobe pulmonary artery. Small mediastinal lymph nodes, suspicious. Mild thickening of the left adrenal gland, unchanged from 2010, indeterminate but likely benign. Additional ancillary findings as above. Electronically Signed   By: Julian Hy M.D.   On: 12/22/2017 23:53   Ct Abdomen Pelvis W Contrast  Result Date: 12/22/2017 CLINICAL DATA:  Abnormal chest radiograph, for staging EXAM: CT CHEST, ABDOMEN, AND PELVIS WITH CONTRAST TECHNIQUE: Multidetector CT imaging of the chest, abdomen and pelvis  was performed following the standard protocol during bolus administration of intravenous contrast. CONTRAST:  164mL ISOVUE-300 IOPAMIDOL (ISOVUE-300) INJECTION 61% COMPARISON:  CT chest dated 12/26/2008 FINDINGS: CT CHEST FINDINGS Cardiovascular: The heart is normal in size. No pericardial effusion. No evidence of thoracic aortic aneurysm. Atherosclerotic calcifications of the aortic arch. Mild three-vessel coronary  atherosclerosis. Mediastinum/Nodes: Small mediastinal lymph nodes, including a 7 mm short axis AP window node and an 8 mm short axis subcarinal node. Visualized thyroid is grossly unremarkable. Lungs/Pleura: 5.6 x 3.9 x 5.8 cm spiculated medial left upper lobe mass (series 6/image 37), transgressing the left fissure, and abutting the left suprahilar region. The mass invades the left upper lobe pulmonary artery (coronal image 74). Moderate centrilobular and paraseptal emphysematous changes. No focal consolidation. No pleural effusion or pneumothorax. Musculoskeletal: Visualized osseous structures are within normal limits. CT ABDOMEN PELVIS FINDINGS Hepatobiliary: Liver is notable for a 14 mm cyst in segment 4A (series 3/image 52). Gallbladder is unremarkable. No intrahepatic or extrahepatic ductal dilatation. Pancreas: Within normal limits. Spleen: Within normal limits. Adrenals/Urinary Tract: Thickening of the left adrenal gland (series 3/image 52), nonspecific and unchanged from 2010. Right adrenal glands within normal limits. Bilateral renal cysts, including a dominant 5.4 cm cyst in the posterior left upper kidney (series 3/image 67). No hydronephrosis. Bladder is mildly thick-walled. Stomach/Bowel: Stomach is within normal limits. No evidence of bowel obstruction. Appendix is not discretely visualized. No colonic wall thickening or mass is seen. Vascular/Lymphatic: No evidence of abdominal aortic aneurysm. Atherosclerotic calcifications of the abdominal aorta and branch vessels. No suspicious abdominopelvic lymphadenopathy. Reproductive: Prostatomegaly, with enlargement of the central gland which indents the base of the bladder (series 3/image 113), suggesting BPH. Other: No abdominopelvic ascites. Tiny fat containing right inguinal hernia (series 3/image 120). Musculoskeletal: Mild degenerative changes of the lumbar spine. IMPRESSION: 5.8 cm spiculated medial left upper lobe mass, compatible with primary  bronchogenic neoplasm. Mass transgresses the left fissure and abuts the left suprahilar region, invading the left upper lobe pulmonary artery. Small mediastinal lymph nodes, suspicious. Mild thickening of the left adrenal gland, unchanged from 2010, indeterminate but likely benign. Additional ancillary findings as above. Electronically Signed   By: Julian Hy M.D.   On: 12/22/2017 23:53    Scheduled Meds: . aspirin  81 mg Oral Daily  . atorvastatin  10 mg Oral q1800  . [START ON 12/24/2017] Influenza vac split quadrivalent PF  0.5 mL Intramuscular Tomorrow-1000  . metoprolol succinate  25 mg Oral Daily   Continuous Infusions: . lactated ringers Stopped (12/23/17 0919)     LOS: 0 days   Time spent: 25 minutes.  Patrecia Pour, MD [facility] [email or web address] Password [email or web address] 12/23/2017, 4:55 PM

## 2017-12-24 DIAGNOSIS — Z515 Encounter for palliative care: Secondary | ICD-10-CM

## 2017-12-24 DIAGNOSIS — Z720 Tobacco use: Secondary | ICD-10-CM

## 2017-12-24 DIAGNOSIS — E43 Unspecified severe protein-calorie malnutrition: Secondary | ICD-10-CM

## 2017-12-24 DIAGNOSIS — M6282 Rhabdomyolysis: Secondary | ICD-10-CM

## 2017-12-24 DIAGNOSIS — Z7189 Other specified counseling: Secondary | ICD-10-CM

## 2017-12-24 DIAGNOSIS — R531 Weakness: Secondary | ICD-10-CM

## 2017-12-24 LAB — GLUCOSE, CAPILLARY
GLUCOSE-CAPILLARY: 114 mg/dL — AB (ref 70–99)
GLUCOSE-CAPILLARY: 127 mg/dL — AB (ref 70–99)
GLUCOSE-CAPILLARY: 85 mg/dL (ref 70–99)

## 2017-12-24 LAB — CK: CK TOTAL: 931 U/L — AB (ref 49–397)

## 2017-12-24 NOTE — Consult Note (Signed)
Consultation Note Date: 12/24/2017   Patient Name: [name] [name]  DOB: [birth year]  MRN: [id number]  Age / Sex: 84 y.o., male  PCP: Patient, No Pcp Per Referring Physician: Patrecia Pour, MD  Reason for Consultation: Establishing goals of care  HPI/Patient Profile: 84 y.o. male admitted on 12/22/2017    Clinical Assessment and Goals of Care:  [name] [name] is an 84 y.o. male with a history of tobacco use, hyperlipidemia and schizophrenia who presented to the ED after being found down on porch by a neighbor. He reported generalized weakness, weight loss, chronic cough, and appeared mildly confused and weak but nonfocal. CXR demonstrated LUL mass, CK up to 782. CT head, chest, abdomen, pelvis confirmed LUL mass encroaching on pulmonary artery. Pulmonology was consulted,  goals of care discussions were carried out with patient and family, who have known of a lung cancer for 10 years and have consistently declined treatment. No intervention was recommended. Cardiology similarly consulted for elevated troponin in the absence of chest pain with echocardiogram showing no focal wall motion abnormalities but global hypokinesis with EF ~20%.   Palliative care was consulted to facilitate further goals of care discussions and a family meeting took place 10/3.   The patient is resting in bed. His family members and loved ones are at the bedside, Niece, friend, caregiver and other relatives also present. I introduced myself and palliative care as follows:  Palliative medicine is specialized medical care for people living with serious illness. It focuses on providing relief from the symptoms and stress of a serious illness. The goal is to improve quality of life for both the patient and the family.  The patient is aware of his condition. He states that he has cancer. Family wanted to review the patient's last CT scan, this  was done, all of their questions addressed to the best of my ability. Code status discussions also undertaken. See below.   Disposition options also discussed, in detail, introduced hospice philosophy of care, the type of care that can be provided in a residential hospice, discussed in detail.   The patient states his appetite is good, he is eating well. He was able to walk with a walker outside his room today.   We discussed in detail about SNF rehab with palliative care to follow. Patient's lab work and other questions also answered to the best of my ability.   Please note additional discussions and recommendations as noted below. Thank you for the consult.     NEXT OF KIN  Niece [name] [name] [phone]  SUMMARY OF RECOMMENDATIONS    1. DNR DNI  2. CSW consult to help facilitate SNF REHAB WITH PALLIATIVE to follow on discharge.  3. Continue current mode of care.  4 continue to encourage PO and PT participation Thank you for the consult.   Code Status/Advance Care Planning:  DNR    Symptom Management:    as above   Palliative Prophylaxis:   Delirium Protocol   Psycho-social/Spiritual:  Desire for further Chaplaincy support:yes  Additional Recommendations: Caregiving  Support/Resources  Prognosis:   < 3 months  Discharge Planning: [place] for rehab with Palliative care service follow-up      Primary Diagnoses: Present on Admission: . Tobacco abuse . Rhabdomyolysis   I have reviewed the medical record, interviewed the patient and family, and examined the patient. The following aspects are pertinent.  Past Medical History:  Diagnosis Date  . HLD (hyperlipidemia)   . Nodule of upper lobe of left lung 2009   PET avid concerning for cancer, decision was made at this time for no further investigation as he would not want treatment    Social History   Socioeconomic History  . Marital status: Single    Spouse name: Not on file  .  Number of children: Not on file  . Years of education: Not on file  . Highest education level: Not on file  Occupational History  . Not on file  Social Needs  . Financial resource strain: Not on file  . Food insecurity:    Worry: Not on file    Inability: Not on file  . Transportation needs:    Medical: Not on file    Non-medical: Not on file  Tobacco Use  . Smoking status: Current Every Day Smoker    Packs/day: 0.50  . Smokeless tobacco: Never Used  Substance and Sexual Activity  . Alcohol use: Yes    Comment: beer  . Drug use: No  . Sexual activity: Not on file  Lifestyle  . Physical activity:    Days per week: Not on file    Minutes per session: Not on file  . Stress: Not on file  Relationships  . Social connections:    Talks on phone: Not on file    Gets together: Not on file    Attends religious service: Not on file    Active member of club or organization: Not on file    Attends meetings of clubs or organizations: Not on file    Relationship status: Not on file  Other Topics Concern  . Not on file  Social History Narrative  . Not on file   Family History  Problem Relation Age of Onset  . Cancer Brother   . Cancer Daughter    Scheduled Meds: . aspirin  81 mg Oral Daily  . metoprolol succinate  25 mg Oral Daily  . nicotine  21 mg Transdermal Daily   Continuous Infusions: PRN Meds:.acetaminophen **OR** acetaminophen, ondansetron **OR** ondansetron (ZOFRAN) IV Medications Prior to Admission:  Prior to Admission medications   Medication Sig Start Date End Date Taking? Authorizing Provider  atorvastatin (LIPITOR) 10 MG tablet Take 10 mg by mouth daily. 10/03/17  Yes [provider]   No Known Allergies Review of Systems +ocassional generalized pain  Physical Exam Weak elderly gentleman No distress Appears chronically ill and frail He has muscle wasting Clear breath sounds S 1 S 2  Abdomen is soft not tender No edema Non focal  Vital  Signs: BP 126/74 (BP Location: Left Arm)   Pulse 68   Temp 98.7 F (37.1 C) (Oral)   Resp 16   Ht 6' (1.829 m)   Wt 70.3 kg   SpO2 100%   BMI 21.02 kg/m  Pain Scale: 0-10   Pain Score: 0-No pain   SpO2: SpO2: 100 % O2 Device:SpO2: 100 % O2 Flow Rate: .   IO: Intake/output summary:   Intake/Output Summary (Last  24 hours) at 12/24/2017 1613 Last data filed at 12/24/2017 1358 Gross per 24 hour  Intake 1573.62 ml  Output 325 ml  Net 1248.62 ml    LBM: Last BM Date: 12/22/17 Baseline Weight: Weight: 70.3 kg Most recent weight: Weight: 70.3 kg     Palliative Assessment/Data:   PPS 40%  Time In: 1500  Time Out:  1610 Time Total:  70 min  Greater than 50%  of this time was spent counseling and coordinating care related to the above assessment and plan.  Signed by: Loistine Chance, MD  [phone]  Please contact Palliative Medicine Team phone at [phone] for questions and concerns.  For individual provider: See Shea Evans

## 2017-12-24 NOTE — Progress Notes (Addendum)
I have reviewed the results of the echocardiogram with the patient, patient is clearly able to comprehend what is going on, I also reviewed the chart and documentation by Dr. Vance Gather, I agree with him totally that patient with bronchogenic carcinoma, episodes of nonsustained ventricular tachycardia, severe decrease in LV systolic function, involvement of pulmonary artery by bronchogenic carcinoma, all are leading to extremely poor and guarded prognosis.  Patient is also wasted, has no family support, has no means of transportation, given overall poor health status and advanced lung cancer and advanced cardiomyopathy, best option is conservative therapy and palliative care.  I discussed the findings with the patient, he clearly states that he does not want to be resuscitated, he does not want to go on the "breathing tube" and does not want anyone to compress his chest, he had heard bad things about this and he does not want this.  He understands that this may lead to his mortality.  He states that he is doing well and wishes to be sent home.  States that nothing is happening here and I feel well.  I also saw the documentation that his niece is 1 of the only surviving close relatives, she plans to visit this afternoon.  After long discussion with the patient, I have reviewed the MOST form and DNR forms. I have also discussed with Dr. Vance Gather regarding conservative therapy.   Will sign off and please call if questions.  If patient does not have any primary care physician, I will be very happy to assume his PCP status for hospice/palliative care. This was a 45 minute encounter. I have also discussed with the RN and SW on the team.   Adrian Prows, MD 12/24/2017, 11:58 AM [facility]. [place] Pager: [phone] Office: [phone] If no answer: Cell:  [phone]

## 2017-12-24 NOTE — Progress Notes (Addendum)
PROGRESS NOTE  [name] [name]  IRJ:[id number] DOB: 1933-10-24 DOA: 12/22/2017 PCP: Patient, No Pcp Per   Brief Narrative: [name] [name] is an 84 y.o. male with a history of tobacco use, hyperlipidemia and schizophrenia who presented to the ED after being found down on porch by a neighbor. He reported generalized weakness, weight loss, chronic cough, and appeared mildly confused and weak but nonfocal. CXR demonstrated LUL mass, CK up to 782. CT head, chest, abdomen, pelvis confirmed LUL mass encroaching on pulmonary artery. Pulmonology was consulted, though goals of care discussions were carried out with patient and family, who have known of a lung cancer for 10 years and have consistently declined treatment. No intervention was recommended. Cardiology similarly consulted for elevated troponin in the absence of chest pain with echocardiogram showing no focal wall motion abnormalities but global hypokinesis with EF ~20%. Palliative care was consulted and family meeting took place 10/3. The patient will require placement and will have palliative services follow there.   Assessment & Plan: Principal Problem:   Weakness generalized Active Problems:   Lung mass   Tobacco abuse   Rhabdomyolysis   Protein-calorie malnutrition, severe  Rhabdomyolysis: CK improved with IV fluids. Unable to continue fluids with severely reduced LV function.  Generalized weakness: Likely from suspected malignancy, malnutrition, likely cause of and caused by rhabdo.  - PT/OT consulted. I anticipate ongoing failure to thrive with untreated malignancy which will require placement. Discussed with CSW that the result of our family meeting this afternoon is to have patient placed with palliative care following.   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  - Pulmonology consulted,  discussed with family who confirm the patient's wishes for conservative management.  - Will consult palliative care team and may need palliative/hospice care to follow at home vs. SNF. - MOST form completed by Dr. Einar Gip who would agree to be outpatient attending for patient if necessary.  Chronic HFrEF: EF ~20%, diffuse hypokinesis.  - No diuretic with rhabdo, will stop IV fluids to avoid overload.   NSTEMI, CAD based on CT chest: Troponins improving. - Cardiology consulted, recommending conservative management. ASA, beta blocker. With rising CK, will hold statin for now.   Severe protein-calorie malnutrition:  - Start regular diet - Dietitian consulted, protein supplementation  Hyperlipidemia:  - Holding statin for now  Tobacco use:  - Cessation counseling  History of schizophrenia: Not displaying psychotic symptoms.  DVT prophylaxis: SCDs Code Status: DNR Family Communication: Niece, her daughter, friends at bedside for family meeting this PM. Disposition Plan: SNF  Consultants:   Pulmonology  Cardiology  Palliative care team  Procedures:   Echocardiogram 12/23/2017: - Left ventricle: The cavity size was normal. Systolic function was   severely reduced. The estimated ejection fraction was in the   range of 15% to 25%. Diffuse hypokinesis. - Left atrium: The atrium was moderately dilated. - Atrial septum: No defect or patent foramen ovale was identified. - Pulmonary arteries: PA peak pressure: 38 mm Hg (S).  Impressions: - The right ventricular systolic pressure was increased consistent   with mild pulmonary hypertension.  Antimicrobials:  None   Subjective: Thinking more clearly, no pain. Starting to eat some more but feels weak.  Objective: Vitals:   12/23/17 2017 12/24/17 0522 12/24/17 1005 12/24/17 1343  BP: (!) 98/51 (!) 122/59 (!) 145/68 126/74  Pulse: 72 62 69 68  Resp: 16     Temp: 98.5 F (36.9 C) 98 F (36.7 C)  98.7 F (37.1 C)  TempSrc:  Oral Oral  Oral  SpO2: 98% 97%  100%  Weight:      Height:        Intake/Output Summary (Last 24 hours) at 12/24/2017 1555 Last data filed at 12/24/2017 1358 Gross per 24 hour  Intake 1573.62 ml  Output 325 ml  Net 1248.62 ml   Filed Weights   12/22/17 2155  Weight: 70.3 kg   Gen: Elderly male in no distress Pulm: Nonlabored breathing room air. Clear. CV: Regular rate and rhythm. No murmur, rub, or gallop. No JVD, no dependent edema. GI: Abdomen soft, non-tender, non-distended, with normoactive bowel sounds.  Ext: Warm, no deformities. Decreased muscle bulk. Skin: No new rashes, lesions or ulcers on visualized skin.  Neuro: Alert and oriented. No focal neurological deficits. Psych: Judgement and insight appear mildly impaired by long term memory deficits. Mood euthymic & affect congruent. Behavior is appropriate.    Data Reviewed: I have personally reviewed following labs and imaging studies  CBC: Recent Labs  Lab 12/22/17 2156 12/23/17 0245  WBC 8.3 7.4  NEUTROABS  --  5.5  HGB 13.4 13.4  HCT 40.5 40.9  MCV 82.7 83.5  PLT 352 825   Basic Metabolic Panel: Recent Labs  Lab 12/22/17 2156 12/23/17 0245  NA 138 141  K 4.6 4.4  CL 101 102  CO2 25 27  GLUCOSE 120* 112*  BUN 16 14  CREATININE 1.01 0.84  CALCIUM 9.3 9.4   GFR: Estimated Creatinine Clearance: 67.4 mL/min (by C-G formula based on SCr of 0.84 mg/dL). Liver Function Tests: Recent Labs  Lab 12/22/17 2156 12/23/17 0245  AST 38 53*  ALT 24 23  ALKPHOS 120 121  BILITOT 0.9 0.7  PROT 7.3 7.5  ALBUMIN 3.6 3.7   No results for input(s): LIPASE, AMYLASE in the last 168 hours. No results for input(s): AMMONIA in the last 168 hours. Coagulation Profile: No results for input(s): INR, PROTIME in the last 168 hours. Cardiac Enzymes: Recent Labs  Lab 12/22/17 2156 12/23/17 0245 12/23/17 0616 12/23/17 1215 12/23/17 1750 12/24/17 0516  CKTOTAL 782* 1,798*  --   --   --  931*  TROPONINI  --  1.99*  1.54* 1.22* 0.97*  --    BNP (last 3 results) No results for input(s): PROBNP in the last 8760 hours. HbA1C: No results for input(s): HGBA1C in the last 72 hours. CBG: Recent Labs  Lab 12/23/17 0627 12/23/17 1026 12/23/17 1741 12/24/17 0010 12/24/17 0828  GLUCAP 109* 86 74 85 127*   Lipid Profile: Recent Labs    12/23/17 0616  CHOL 103  HDL 30*  LDLCALC 68  TRIG 27  CHOLHDL 3.4   Thyroid Function Tests: No results for input(s): TSH, T4TOTAL, FREET4, T3FREE, THYROIDAB in the last 72 hours. Anemia Panel: No results for input(s): VITAMINB12, FOLATE, FERRITIN, TIBC, IRON, RETICCTPCT in the last 72 hours. Urine analysis:    Component Value Date/Time   COLORURINE YELLOW 12/22/2017 2020   APPEARANCEUR CLEAR 12/22/2017 2020   LABSPEC 1.005 12/22/2017 2020   PHURINE 5.0 12/22/2017 2020   GLUCOSEU NEGATIVE 12/22/2017 2020   HGBUR SMALL (A) 12/22/2017 2020   BILIRUBINUR NEGATIVE 12/22/2017 2020   KETONESUR NEGATIVE 12/22/2017 2020   PROTEINUR NEGATIVE 12/22/2017 2020   UROBILINOGEN 0.2 11/30/2011 0126   NITRITE NEGATIVE 12/22/2017 2020   LEUKOCYTESUR NEGATIVE 12/22/2017 2020   No results found  for this or any previous visit (from the past 240 hour(s)).    Radiology Studies: Dg Chest 2 View  Result Date: 12/22/2017 CLINICAL DATA:  Weakness EXAM: CHEST - 2 VIEW COMPARISON:  CT chest dated 12/26/2008 FINDINGS: 6.2 cm mass in the left upper lobe. Right lung is clear. No pleural effusion or pneumothorax. The heart is normal in size. Visualized osseous structures are within normal limits. IMPRESSION: 6.2 cm mass in the left upper lobe, highly suspicious for primary bronchogenic neoplasm. Notably, a 2.6 cm nodule was present in the left upper lobe on prior CT, although presumably interval treatment has occurred. Consider CT chest with contrast for further evaluation. Electronically Signed   By: Julian Hy M.D.   On: 12/22/2017 21:14   Ct Head Wo Contrast  Result Date:  12/22/2017 CLINICAL DATA:  Weakness EXAM: CT HEAD WITHOUT CONTRAST TECHNIQUE: Contiguous axial images were obtained from the base of the skull through the vertex without intravenous contrast. COMPARISON:  11/29/2011 FINDINGS: Brain: No evidence of acute infarction, hemorrhage, hydrocephalus, extra-axial collection or mass lesion/mass effect. Mild cortical atrophy. Subcortical white matter and periventricular small vessel ischemic changes. Vascular: Intracranial atherosclerosis. Skull: Normal. Negative for fracture or focal lesion. Sinuses/Orbits: The visualized paranasal sinuses are essentially clear. The mastoid air cells are unopacified. Other: None. IMPRESSION: No evidence of acute intracranial abnormality. Mild atrophy with small vessel ischemic changes. Electronically Signed   By: Julian Hy M.D.   On: 12/22/2017 21:15   Ct Head W Contrast  Result Date: 12/23/2017 CLINICAL DATA:  84 y/o  M; staging of possible lung cancer. EXAM: CT HEAD WITH CONTRAST TECHNIQUE: Contiguous axial images were obtained from the base of the skull through the vertex with intravenous contrast. CONTRAST:  159mL ISOVUE-300 IOPAMIDOL (ISOVUE-300) INJECTION 61% COMPARISON:  12/22/2017 and 11/29/2011 CT head FINDINGS: Brain: No evidence of acute infarction, hemorrhage, hydrocephalus, extra-axial collection or mass lesion/mass effect. Stable chronic microvascular ischemic changes and volume loss of the brain. Vascular: Calcific atherosclerosis of carotid siphons. No hyperdense vessel identified. Skull: Normal. Negative for fracture or focal lesion. Sinuses/Orbits: No acute finding. Other: None. IMPRESSION: 1. No evidence of metastatic disease to the brain or calvarium. 2. Stable chronic findings as above. Electronically Signed   By: Kristine Garbe M.D.   On: 12/23/2017 00:15   Ct Chest W Contrast  Result Date: 12/22/2017 CLINICAL DATA:  Abnormal chest radiograph, for staging EXAM: CT CHEST, ABDOMEN, AND PELVIS WITH  CONTRAST TECHNIQUE: Multidetector CT imaging of the chest, abdomen and pelvis was performed following the standard protocol during bolus administration of intravenous contrast. CONTRAST:  161mL ISOVUE-300 IOPAMIDOL (ISOVUE-300) INJECTION 61% COMPARISON:  CT chest dated 12/26/2008 FINDINGS: CT CHEST FINDINGS Cardiovascular: The heart is normal in size. No pericardial effusion. No evidence of thoracic aortic aneurysm. Atherosclerotic calcifications of the aortic arch. Mild three-vessel coronary atherosclerosis. Mediastinum/Nodes: Small mediastinal lymph nodes, including a 7 mm short axis AP window node and an 8 mm short axis subcarinal node. Visualized thyroid is grossly unremarkable. Lungs/Pleura: 5.6 x 3.9 x 5.8 cm spiculated medial left upper lobe mass (series 6/image 37), transgressing the left fissure, and abutting the left suprahilar region. The mass invades the left upper lobe pulmonary artery (coronal image 74). Moderate centrilobular and paraseptal emphysematous changes. No focal consolidation. No pleural effusion or pneumothorax. Musculoskeletal: Visualized osseous structures are within normal limits. CT ABDOMEN PELVIS FINDINGS Hepatobiliary: Liver is notable for a 14 mm cyst in segment 4A (series 3/image 52). Gallbladder is unremarkable. No intrahepatic or extrahepatic ductal  dilatation. Pancreas: Within normal limits. Spleen: Within normal limits. Adrenals/Urinary Tract: Thickening of the left adrenal gland (series 3/image 52), nonspecific and unchanged from 2010. Right adrenal glands within normal limits. Bilateral renal cysts, including a dominant 5.4 cm cyst in the posterior left upper kidney (series 3/image 67). No hydronephrosis. Bladder is mildly thick-walled. Stomach/Bowel: Stomach is within normal limits. No evidence of bowel obstruction. Appendix is not discretely visualized. No colonic wall thickening or mass is seen. Vascular/Lymphatic: No evidence of abdominal aortic aneurysm. Atherosclerotic  calcifications of the abdominal aorta and branch vessels. No suspicious abdominopelvic lymphadenopathy. Reproductive: Prostatomegaly, with enlargement of the central gland which indents the base of the bladder (series 3/image 113), suggesting BPH. Other: No abdominopelvic ascites. Tiny fat containing right inguinal hernia (series 3/image 120). Musculoskeletal: Mild degenerative changes of the lumbar spine. IMPRESSION: 5.8 cm spiculated medial left upper lobe mass, compatible with primary bronchogenic neoplasm. Mass transgresses the left fissure and abuts the left suprahilar region, invading the left upper lobe pulmonary artery. Small mediastinal lymph nodes, suspicious. Mild thickening of the left adrenal gland, unchanged from 2010, indeterminate but likely benign. Additional ancillary findings as above. Electronically Signed   By: Julian Hy M.D.   On: 12/22/2017 23:53   Ct Abdomen Pelvis W Contrast  Result Date: 12/22/2017 CLINICAL DATA:  Abnormal chest radiograph, for staging EXAM: CT CHEST, ABDOMEN, AND PELVIS WITH CONTRAST TECHNIQUE: Multidetector CT imaging of the chest, abdomen and pelvis was performed following the standard protocol during bolus administration of intravenous contrast. CONTRAST:  157mL ISOVUE-300 IOPAMIDOL (ISOVUE-300) INJECTION 61% COMPARISON:  CT chest dated 12/26/2008 FINDINGS: CT CHEST FINDINGS Cardiovascular: The heart is normal in size. No pericardial effusion. No evidence of thoracic aortic aneurysm. Atherosclerotic calcifications of the aortic arch. Mild three-vessel coronary atherosclerosis. Mediastinum/Nodes: Small mediastinal lymph nodes, including a 7 mm short axis AP window node and an 8 mm short axis subcarinal node. Visualized thyroid is grossly unremarkable. Lungs/Pleura: 5.6 x 3.9 x 5.8 cm spiculated medial left upper lobe mass (series 6/image 37), transgressing the left fissure, and abutting the left suprahilar region. The mass invades the left upper lobe  pulmonary artery (coronal image 74). Moderate centrilobular and paraseptal emphysematous changes. No focal consolidation. No pleural effusion or pneumothorax. Musculoskeletal: Visualized osseous structures are within normal limits. CT ABDOMEN PELVIS FINDINGS Hepatobiliary: Liver is notable for a 14 mm cyst in segment 4A (series 3/image 52). Gallbladder is unremarkable. No intrahepatic or extrahepatic ductal dilatation. Pancreas: Within normal limits. Spleen: Within normal limits. Adrenals/Urinary Tract: Thickening of the left adrenal gland (series 3/image 52), nonspecific and unchanged from 2010. Right adrenal glands within normal limits. Bilateral renal cysts, including a dominant 5.4 cm cyst in the posterior left upper kidney (series 3/image 67). No hydronephrosis. Bladder is mildly thick-walled. Stomach/Bowel: Stomach is within normal limits. No evidence of bowel obstruction. Appendix is not discretely visualized. No colonic wall thickening or mass is seen. Vascular/Lymphatic: No evidence of abdominal aortic aneurysm. Atherosclerotic calcifications of the abdominal aorta and branch vessels. No suspicious abdominopelvic lymphadenopathy. Reproductive: Prostatomegaly, with enlargement of the central gland which indents the base of the bladder (series 3/image 113), suggesting BPH. Other: No abdominopelvic ascites. Tiny fat containing right inguinal hernia (series 3/image 120). Musculoskeletal: Mild degenerative changes of the lumbar spine. IMPRESSION: 5.8 cm spiculated medial left upper lobe mass, compatible with primary bronchogenic neoplasm. Mass transgresses the left fissure and abuts the left suprahilar region, invading the left upper lobe pulmonary artery. Small mediastinal lymph nodes, suspicious. Mild thickening of the  left adrenal gland, unchanged from 2010, indeterminate but likely benign. Additional ancillary findings as above. Electronically Signed   By: Julian Hy M.D.   On: 12/22/2017 23:53     Scheduled Meds: . aspirin  81 mg Oral Daily  . metoprolol succinate  25 mg Oral Daily  . nicotine  21 mg Transdermal Daily   Continuous Infusions:    LOS: 1 day   Time spent: 25 minutes.  Patrecia Pour, MD [facility] [email or web address] Password [email or web address] 12/24/2017, 3:55 PM

## 2017-12-25 LAB — GLUCOSE, CAPILLARY
GLUCOSE-CAPILLARY: 103 mg/dL — AB (ref 70–99)
Glucose-Capillary: 92 mg/dL (ref 70–99)
Glucose-Capillary: 132 mg/dL — ABNORMAL HIGH (ref 70–99)
Glucose-Capillary: 112 mg/dL — ABNORMAL HIGH (ref 70–99)

## 2017-12-25 NOTE — Progress Notes (Signed)
Physical Therapy Treatment Patient Details Name: [name] [name] MRN: [id number] DOB: May 01, 1933 Today's Date: 12/25/2017    History of Present Illness 84 y.o. male with history of tobacco abuse, hyperlipidemia and as per the previous chart schizophrenia presently taking only 2 medications as per the patient which we needs to be confirmed was brought to the ER after patient and neighbor found the patient was lying on the porch for long hours.  Pt admitted 12/22/17 with generalized weakness and also found to have left upper lobe mass encroaching onto the pulmonary artery    PT Comments    Assisted OOB to amb to bathroom.  Very unsteady gait.   amb with and without walker as prior pt did not use any AD.  Attempted gait without AD pt required Min Assist due to LOB with instability.  Also present with generalized weakness and slow/delayed corrective reaction responce.  HIGH FALL RISK.  Attempted amb with walker, pt required assist and VC's on proper walker to self distance, safety with turns and upright posture.  Limited distance due to weakness and fatigue.  Deconditioned. Performed BERG balance test, pt scored 30/56 indicating 100% fall risk Pt will need ST Rehab at SNF prior to returning home    Follow Up Recommendations  SNF     Equipment Recommendations       Recommendations for Other Services       Precautions / Restrictions Precautions Precautions: Fall Restrictions Weight Bearing Restrictions: No    Mobility  Bed Mobility Overal bed mobility: Needs Assistance Bed Mobility: Supine to Sit;Sit to Supine     Supine to sit: Supervision Sit to supine: Supervision   General bed mobility comments: increased time, slow to move  Transfers Overall transfer level: Needs assistance Equipment used: Rolling walker (2 wheeled);None Transfers: Sit to/from [employer] to Stand: Min guard Stand pivot transfers: Min guard;Min assist       General transfer  comment: increased time, slow to move, delayed corrective reaction responce, Vc's safety with turns in bathroom  Ambulation/Gait Ambulation/Gait assistance: Min guard;Min assist Gait Distance (Feet): 20 Feet RA 98% HR 115 Assistive device: Rolling walker (2 wheeled);None Gait Pattern/deviations: Step-through pattern;Decreased stride length;Wide base of support Gait velocity: decreased    General Gait Details: amb with and without walker as prior pt did not use any AD.  Attempted gait without AD pt required Min Assist due to LOB with instability.  Also present with generalized weakness and slow/delayed corrective reaction responce.  HIGH FALL RISK.  Attempted amb with walker, pt required assist and VC's on proper walker to self distance, safety with turns and upright posture.  Limited distance due to weakness and fatigue.  Deconditioned.   Stairs             Wheelchair Mobility    Modified Rankin (Stroke Patients Only)       Balance Overall balance assessment: History of Falls                               Standardized Balance Assessment Standardized Balance Assessment : Berg Balance Test Berg Balance Test Sit to Stand: Able to stand  independently using hands Standing Unsupported: Able to stand 2 minutes with supervision Sitting with Back Unsupported but Feet Supported on Floor or Stool: Able to sit safely and securely 2 minutes Stand to Sit: Controls descent by using hands Transfers: Able to transfer safely, definite need of hands Standing  Unsupported with Eyes Closed: Able to stand 10 seconds with supervision Standing Ubsupported with Feet Together: Needs help to attain position and unable to hold for 15 seconds From Standing, Reach Forward with Outstretched Arm: Can reach forward >12 cm safely (5") From Standing Position, Pick up Object from Floor: Able to pick up shoe, needs supervision From Standing Position, Turn to Look Behind Over each Shoulder: Looks  behind from both sides and weight shifts well Turn 360 Degrees: Needs close supervision or verbal cueing Standing Unsupported, Alternately Place Feet on Step/Stool: Needs assistance to keep from falling or unable to try Standing Unsupported, One Foot in Front: Loses balance while stepping or standing Standing on One Leg: Unable to try or needs assist to prevent fall Total Score: 30        Cognition Arousal/Alertness: Awake/alert Behavior During Therapy: Flat affect Overall Cognitive Status: No family/caregiver present to determine baseline cognitive functioning                                 General Comments: appropriate during session, follows commands      Exercises      General Comments General comments (skin integrity, edema, etc.): score 30/56 High Fall Risk 100%, pt should use a walker full-time      Pertinent Vitals/Pain Pain Assessment: No/denies pain    Home Living                      Prior Function            PT Goals (current goals can now be found in the care plan section) Progress towards PT goals: Progressing toward goals    Frequency    Min 2X/week      PT Plan Current plan remains appropriate    Co-evaluation              AM-PAC PT "6 Clicks" Daily Activity  Outcome Measure  Difficulty turning over in bed (including adjusting bedclothes, sheets and blankets)?: A Little Difficulty moving from lying on back to sitting on the side of the bed? : A Little Difficulty sitting down on and standing up from a chair with arms (e.g., wheelchair, bedside commode, etc,.)?: A Little Help needed moving to and from a bed to chair (including a wheelchair)?: A Little Help needed walking in hospital room?: A Little Help needed climbing 3-5 steps with a railing? : A Lot 6 Click Score: 17    End of Session Equipment Utilized During Treatment: Gait belt Activity Tolerance: Patient tolerated treatment well Patient left: in bed;with  call bell/phone within reach;with bed alarm set Nurse Communication: Mobility status PT Visit Diagnosis: Unsteadiness on feet (R26.81)     Time: 1110-1135 PT Time Calculation (min) (ACUTE ONLY): 25 min  Charges:  $Gait Training: 8-22 mins $Therapeutic Activity: 8-22 mins                     Rica Koyanagi  PTA Acute  Rehabilitation Services Pager      [phone] Office      [phone]

## 2017-12-25 NOTE — Clinical Social Work Note (Signed)
Clinical Social Work Assessment  Patient Details  Name: [name] [name] MRN: [id number] Date of Birth: Oct 15, 1933  Date of referral:  12/25/17               Reason for consult:  Facility Placement                Permission sought to share information with:    Permission granted to share information::  Yes, Verbal Permission Granted  Name::        Agency::     Relationship::     Contact Information:     Housing/Transportation Living arrangements for the past 2 months:  Single Family Home Source of Information:  Patient Patient Interpreter Needed:  None Criminal Activity/Legal Involvement Pertinent to Current Situation/Hospitalization:  No - Comment as needed Significant Relationships:  Friend(Caregiver and friend [name] [name]) Lives with:  Self, Friends Do you feel safe going back to the place where you live?  Yes Need for family participation in patient care:  No (Coment)  Care giving concerns:  This is an 84 year old male with a past medical history of tobacco abuse, hyperlipidemia, schizophrenia, questionable cognitive delay, who lives alone with a occasional caregiver. PT recommends SNF.  Social Worker assessment / plan:   CSW met with patient at bedside to introduce self and roll, complete assessment, and to discuss SNF recommendation.  The patient reports that he lives in a room in a private residence in [place] and that he primarily cares for himself. The patient reports that he has lived in this residence for two years, he is satisfied with his living arrangements, and that he feels safe. The patient reports that his friend, [name] [name], takes him to purchase groceries and to appointments as he does not drive. The patient reports he has no local family members.  The patient reports he is able to navigate his home without using assistive devices such as a walker, cane, or wheelchair. The patient states he has never been to a SNF before and would like to discuss the recommendation with  his friend and caretaker.   CSW notes physician notes from 10/02 relaying that patient appeared confused as to why he was hospitalized and in regard to his prior medical history.  Employment status:  Retired Medical sales representative) PT Recommendations:  [place] / Referral to community resources:  [place]  Patient/Family's Response to care:  Patient was pleasant and engaged with CSW. Patient is unsure if he wishes to pursue SNF placement at this time and states he will discuss the recommendation with his friend and caregiver. Patient has no further questions for CSW at this time, but was encouraged to follow up should questions arise.  Patient/Family's Understanding of and Emotional Response to Diagnosis, Current Treatment, and Prognosis:   PT acknowledged understanding of SNF recommendation and that SNF is a short term skilled care facility where he could take part in PT/OT and have a higher level of care compared to home. Patient wishes to discuss SNF with his friend and caregiver.   Emotional Assessment Appearance:  Appears stated age Attitude/Demeanor/Rapport:    Affect (typically observed):  Accepting, Pleasant, Appropriate Orientation:  Oriented to Self, Oriented to Place, Oriented to  Time, Oriented to Situation Alcohol / Substance use:    Psych involvement (Current and /or in the community):  No (Comment)  Discharge Needs  Concerns to be addressed:  Care Coordination Readmission within the last 30 days:  No Current discharge risk:  None Barriers to Discharge:  [place], [place] 12/25/2017, 1:26 PM

## 2017-12-25 NOTE — Progress Notes (Signed)
CSW met with patient's niece, caretaker Rica Mote, and a family friend at bedside to review recommendations and plan of care.   Patient's niece, Larri Yehle [phone]), agreeable to plan for SNF. Her long term goal is to get patient into an ALF and is aware that this would incur out of pocket costs.   Niece says her preference for SNF is [facility] in [place] as it is very close to her home and she could visit the patient daily.  CSW reviewed barriers to discharge and the process of working toward SNF placement, patient's family voiced understanding.  Stephanie Acre, [place] Social Worker [phone]

## 2017-12-25 NOTE — Care Management Note (Signed)
Case Management Note  Patient Details  Name: [name] [name] MRN: [id number] Date of Birth: 28-Jan-1934  Subjective/Objective:  Noted PT recc SNF. CSW following for SNF w/PCS. Medically stable for d/c.                  Action/Plan:dc SNF w/PCS.   Expected Discharge Date:                  Expected Discharge Plan:  Skilled Nursing Facility  In-House Referral:  Clinical Social Work  Discharge planning Services     Post Acute Care Choice:    Choice offered to:     DME Arranged:    DME Agency:     HH Arranged:    [place] Agency:     Status of Service:  Completed, signed off  If discussed at [employer], dates discussed:    Additional Comments:  Dessa Phi, RN 12/25/2017, 12:03 PM

## 2017-12-25 NOTE — NC FL2 (Signed)
  [place] LEVEL OF CARE SCREENING TOOL     IDENTIFICATION  Patient Name: [name] [name] Birthdate: 1933/10/31 Sex: male Admission Date (Current Location): 12/22/2017  [facility] and [place] Number:  Herbalist and Address:  [facility],  [place] [address], [place]      Provider Number: [id number]  Attending Physician Name and Address:  Patrecia Pour, MD  Relative Name and Phone Number:       Current Level of Care: Hospital Recommended Level of Care: [place] Prior Approval Number:    Date Approved/Denied:   PASRR Number: Manual Review in Progress  Discharge Plan: SNF    Current Diagnoses: Patient Active Problem List   Diagnosis Date Noted  . Encounter for palliative care   . Goals of care, counseling/discussion   . Weakness generalized 12/23/2017  . Lung mass 12/23/2017  . Tobacco abuse 12/23/2017  . Rhabdomyolysis 12/23/2017  . Protein-calorie malnutrition, severe 12/23/2017    Orientation RESPIRATION BLADDER Height & Weight     Situation, Place, Time, Self  Normal Continent Weight: 155 lb (70.3 kg) Height:  6' (182.9 cm)  BEHAVIORAL SYMPTOMS/MOOD NEUROLOGICAL BOWEL NUTRITION STATUS      Continent Diet  AMBULATORY STATUS COMMUNICATION OF NEEDS Skin   Limited Assist Verbally Normal                       Personal Care Assistance Level of Assistance  Bathing, Feeding, Dressing Bathing Assistance: Limited assistance Feeding assistance: Independent Dressing Assistance: Limited assistance     Functional Limitations Info             SPECIAL CARE FACTORS FREQUENCY  PT (By licensed PT), OT (By licensed OT)     PT Frequency: 2x week OT Frequency: 2x week            Contractures Contractures Info: Not present    Additional Factors Info  Code Status, Allergies Code Status Info: DNR Allergies Info: No known allergies           Current Medications (12/25/2017):  This is the  current hospital active medication list Current Facility-Administered Medications  Medication Dose Route Frequency Provider Last Rate Last Dose  . acetaminophen (TYLENOL) tablet 650 mg  650 mg Oral Q6H PRN Rise Patience, MD       Or  . acetaminophen (TYLENOL) suppository 650 mg  650 mg Rectal Q6H PRN Rise Patience, MD      . aspirin chewable tablet 81 mg  81 mg Oral Daily Adrian Prows, MD   81 mg at 12/25/17 1110  . metoprolol succinate (TOPROL-XL) 24 hr tablet 25 mg  25 mg Oral Daily Adrian Prows, MD   25 mg at 12/25/17 1111  . nicotine (NICODERM CQ - dosed in mg/24 hours) patch 21 mg  21 mg Transdermal Daily Patrecia Pour, MD   21 mg at 12/25/17 1110  . ondansetron (ZOFRAN) tablet 4 mg  4 mg Oral Q6H PRN Rise Patience, MD       Or  . ondansetron [facility]) injection 4 mg  4 mg Intravenous Q6H PRN Rise Patience, MD         Discharge Medications: Please see discharge summary for a list of discharge medications.  Relevant Imaging Results:  Relevant Lab Results:   Additional Information SSN: [id number]  Joellen [place], [place]

## 2017-12-25 NOTE — Progress Notes (Addendum)
PT recommended for SNF. CSW completed assessment, FL2, and PASSR. PASSR will require a manual review due to prior diagnosis of schizophrenia.   Barriers to discharge: manual PASSR review, insurance authorization.  CSW continuing to follow for discharge needs.     Stephanie Acre, [place] Social Worker [phone]

## 2017-12-25 NOTE — Progress Notes (Signed)
PROGRESS NOTE  AVANTAE [name]  WCB:[id number] DOB: 1933/09/29 DOA: 12/22/2017 PCP: Patient, No Pcp Per   Brief Narrative: [name] [name] is an 84 y.o. male with a history of tobacco use, hyperlipidemia and schizophrenia who presented to the ED after being found down on porch by a neighbor. He reported generalized weakness, weight loss, chronic cough, and appeared mildly confused and weak but nonfocal. CXR demonstrated LUL mass, CK up to 782. CT head, chest, abdomen, pelvis confirmed LUL mass encroaching on pulmonary artery. Pulmonology was consulted, though goals of care discussions were carried out with patient and family, who have known of a lung cancer for 10 years and have consistently declined treatment. No intervention was recommended. Cardiology similarly consulted for elevated troponin in the absence of chest pain with echocardiogram showing no focal wall motion abnormalities but global hypokinesis with EF ~20%. Palliative care was consulted and family meeting took place 10/3. The patient will require placement and will have palliative services follow there.   Assessment & Plan: Principal Problem:   Weakness generalized Active Problems:   Lung mass   Tobacco abuse   Rhabdomyolysis   Protein-calorie malnutrition, severe   Encounter for palliative care   Goals of care, counseling/discussion  Rhabdomyolysis: CK improved with IV fluids. Unable to continue fluids with severely reduced LV function.  Generalized weakness: Likely from suspected malignancy, malnutrition, likely cause of and caused by rhabdo.  - PT recommends SNF. I anticipate ongoing failure to thrive with untreated malignancy which will require placement and may be forestalled by daily PT.  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  - Pulmonology consulted, discussed with family who  confirm the patient's wishes for conservative management.  - Palliative care performed family meeting 10/3. MOST form completed by Dr. Einar Gip who would agree to be outpatient attending for patient if necessary.  Chronic HFrEF: EF ~20%, diffuse hypokinesis.  - No diuretic with rhabdo, appears euvolemic.  NSTEMI, CAD based on CT chest: Troponins improving. - Cardiology consulted, recommending conservative management. ASA, beta blocker. With elevated CK, will hold statin for now.   Severe protein-calorie malnutrition:  - Start regular diet - Dietitian consulted, protein supplementation  Hyperlipidemia:  - Holding statin for now  Tobacco use:  - Cessation counseling  History of schizophrenia: Not displaying any symptoms. No psychosis.  DVT prophylaxis: SCDs Code Status: DNR Family Communication: None at bedside this AM Disposition Plan: SNF when bed available. He is medically stable for discharge. Per CSW, pt needs manual PASRR review due to hx schizophrenia then insurance authorization.  Consultants:   Pulmonology  Cardiology  Palliative care team  Procedures:   Echocardiogram 12/23/2017: - Left ventricle: The cavity size was normal. Systolic function was   severely reduced. The estimated ejection fraction was in the   range of 15% to 25%. Diffuse hypokinesis. - Left atrium: The atrium was moderately dilated. - Atrial septum: No defect or patent foramen ovale was identified. - Pulmonary arteries: PA peak pressure: 38 mm Hg (S).  Impressions: - The right ventricular systolic pressure was increased consistent   with mild pulmonary hypertension.  Antimicrobials:  None   Subjective: Ate breakfast, more than he used to eat. Feeling better but still weak. No chest pain or dyspnea.  Objective: Vitals:   12/24/17 1005 12/24/17 1343 12/24/17 2121 12/25/17 0441  BP: (!) 145/68 126/74 138/83 129/77  Pulse: 69  68 75 70  Resp:   (!) 24 (!) 24  Temp:  98.7 F (37.1 C) 99.6  F (37.6 C) 99.4 F (37.4 C)  TempSrc:  Oral Oral Oral  SpO2:  100% 97% 98%  Weight:      Height:        Intake/Output Summary (Last 24 hours) at 12/25/2017 1417 Last data filed at 12/25/2017 0600 Gross per 24 hour  Intake 100 ml  Output 350 ml  Net -250 ml   Filed Weights   12/22/17 2155  Weight: 70.3 kg   Gen: 84 y.o. male in no distress Pulm: Nonlabored breathing room air. Clear. CV: Regular rate and rhythm. No murmur, rub, or gallop. No JVD, no dependent edema. GI: Abdomen soft, non-tender, non-distended, with normoactive bowel sounds.  Ext: Warm, no deformities Skin: No rashes, lesions or ulcers on visualized skin.  Neuro: Alert and oriented. No focal neurological deficits. Diffusely weak. Psych: Judgement and insight appear improved, still some delayed speech and memory deficits but conversant, able to follow linear train of thought. Not responding to internal stimuli, no AVH or SI/HI. Mood euthymic & affect congruent. Behavior is appropriate.    CBC: Recent Labs  Lab 12/22/17 2156 12/23/17 0245  WBC 8.3 7.4  NEUTROABS  --  5.5  HGB 13.4 13.4  HCT 40.5 40.9  MCV 82.7 83.5  PLT 352 161   Basic Metabolic Panel: Recent Labs  Lab 12/22/17 2156 12/23/17 0245  NA 138 141  K 4.6 4.4  CL 101 102  CO2 25 27  GLUCOSE 120* 112*  BUN 16 14  CREATININE 1.01 0.84  CALCIUM 9.3 9.4   GFR: Estimated Creatinine Clearance: 67.4 mL/min (by C-G formula based on SCr of 0.84 mg/dL).   Liver Function Tests: Recent Labs  Lab 12/22/17 2156 12/23/17 0245  AST 38 53*  ALT 24 23  ALKPHOS 120 121  BILITOT 0.9 0.7  PROT 7.3 7.5  ALBUMIN 3.6 3.7   Cardiac Enzymes: Recent Labs  Lab 12/22/17 2156 12/23/17 0245 12/23/17 0616 12/23/17 1215 12/23/17 1750 12/24/17 0516  CKTOTAL 782* 1,798*  --   --   --  931*  TROPONINI  --  1.99* 1.54* 1.22* 0.97*  --    CBG: Recent Labs  Lab 12/24/17 0010 12/24/17 0828 12/24/17 1712 12/25/17 0016 12/25/17 0719  GLUCAP 85 127*  114* 103* 92   Lipid Profile: Recent Labs    12/23/17 0616  CHOL 103  HDL 30*  LDLCALC 68  TRIG 27  CHOLHDL 3.4   Urine analysis:    Component Value Date/Time   COLORURINE YELLOW 12/22/2017 2020   APPEARANCEUR CLEAR 12/22/2017 2020   LABSPEC 1.005 12/22/2017 2020   PHURINE 5.0 12/22/2017 2020   GLUCOSEU NEGATIVE 12/22/2017 2020   HGBUR SMALL (A) 12/22/2017 2020   BILIRUBINUR NEGATIVE 12/22/2017 2020   KETONESUR NEGATIVE 12/22/2017 2020   PROTEINUR NEGATIVE 12/22/2017 2020   UROBILINOGEN 0.2 11/30/2011 0126   NITRITE NEGATIVE 12/22/2017 2020   LEUKOCYTESUR NEGATIVE 12/22/2017 2020    LOS: 2 days   Time spent: 25 minutes.  Patrecia Pour, MD [facility] [email or web address] Password [email or web address] 12/25/2017, 2:17 PM

## 2017-12-26 LAB — GLUCOSE, CAPILLARY
GLUCOSE-CAPILLARY: 88 mg/dL (ref 70–99)
GLUCOSE-CAPILLARY: 98 mg/dL (ref 70–99)

## 2017-12-26 NOTE — Progress Notes (Signed)
PROGRESS NOTE  [name] [name]  DQQ:[id number] DOB: Dec 10, 1933 DOA: 12/22/2017 PCP: Patient, No Pcp Per   Brief Narrative: [name] [name] is an 84 y.o. male with a history of tobacco use, hyperlipidemia and schizophrenia who presented to the ED after being found down on porch by a neighbor. He reported generalized weakness, weight loss, chronic cough, and appeared mildly confused and weak but nonfocal. CXR demonstrated LUL mass, CK up to 782. CT head, chest, abdomen, pelvis confirmed LUL mass encroaching on pulmonary artery. Pulmonology was consulted, though goals of care discussions were carried out with patient and family, who have known of a lung cancer for 10 years and have consistently declined treatment. No intervention was recommended. Cardiology similarly consulted for elevated troponin in the absence of chest pain with echocardiogram showing no focal wall motion abnormalities but global hypokinesis with EF ~20%. Palliative care was consulted and family meeting took place 10/3. The patient will require placement and will have palliative services follow there.   Assessment & Plan: Principal Problem:   Weakness generalized Active Problems:   Lung mass   Tobacco abuse   Rhabdomyolysis   Protein-calorie malnutrition, severe   Encounter for palliative care   Goals of care, counseling/discussion  Rhabdomyolysis: CK improved with IV fluids. Unable to continue fluids with severely reduced LV function.  Generalized weakness: Likely from suspected malignancy, malnutrition, likely cause of and caused by rhabdo.  - PT recommends SNF. I anticipate ongoing failure to thrive with untreated malignancy which will require placement and may be forestalled by daily PT.  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  - Pulmonology consulted, discussed with family who  confirm the patient's wishes for conservative management.  - Palliative care performed family meeting 10/3, goal is for comfort and remaining functionally as independent as possible for as long as possible. No invasive procedures. MOST form completed by Dr. Einar Gip who would agree to be outpatient attending for patient if necessary.  Chronic HFrEF: EF ~20%, diffuse hypokinesis.  - No diuretic with rhabdo, appears euvolemic.  NSTEMI, CAD based on CT chest: Troponins improving. - Cardiology consulted, recommending conservative management. ASA, beta blocker. With elevated CK, will hold statin for now.   Severe protein-calorie malnutrition:  - Start regular diet - Dietitian consulted, protein supplementation  Hyperlipidemia:  - Holding statin   Tobacco use:  - Cessation counseling  History of schizophrenia: Not displaying any symptoms. No psychosis.  DVT prophylaxis: SCDs Code Status: DNR Family Communication: None at bedside this AM Disposition Plan: Awaiting SNF bed. Apparently will be a while due to need for manual PASRR review and subsequent insurance authorization.  Consultants:   Pulmonology  Cardiology  Palliative care team  Procedures:   Echocardiogram 12/23/2017: - Left ventricle: The cavity size was normal. Systolic function was   severely reduced. The estimated ejection fraction was in the   range of 15% to 25%. Diffuse hypokinesis. - Left atrium: The atrium was moderately dilated. - Atrial septum: No defect or patent foramen ovale was identified. - Pulmonary arteries: PA peak pressure: 38 mm Hg (S).  Impressions: - The right ventricular systolic pressure was increased consistent   with mild pulmonary hypertension.  Antimicrobials:  None   Subjective: Eating well, working with PT. Ready for discharge.  Objective: Vitals:   12/25/17 0441 12/25/17 1607 12/25/17 2200 12/26/17 0538  BP: 129/77 121/61 140/74 140/82  Pulse: 70 90 70 69  Resp: (!) 24  20 20     Temp: 99.4 F (37.4 C) 99 F (37.2 C) 98.3 F (36.8 C) 97.8 F (36.6 C)  TempSrc: Oral Oral Oral Oral  SpO2: 98% 98% 100% 99%  Weight:      Height:        Intake/Output Summary (Last 24 hours) at 12/26/2017 1221 Last data filed at 12/26/2017 5027 Gross per 24 hour  Intake 100 ml  Output 750 ml  Net -650 ml   Filed Weights   12/22/17 2155  Weight: 70.3 kg   Pleasant, elderly thin male in no distress Nonlabored, clear on room air RRR, no m/r/g, JVD or edema Soft, NT, ND, +BS Alert, conversant with some memory deficits but short term recall is fair. No SI, HI, AVH. Linear thought process. No focal deficits Globally reduced muscle bulk.   Time spent: 15 minutes.  Patrecia Pour, MD [facility] [email or web address] Password [email or web address] 12/26/2017, 12:21 PM

## 2017-12-27 LAB — GLUCOSE, CAPILLARY
GLUCOSE-CAPILLARY: 106 mg/dL — AB (ref 70–99)
GLUCOSE-CAPILLARY: 88 mg/dL (ref 70–99)
Glucose-Capillary: 100 mg/dL — ABNORMAL HIGH (ref 70–99)

## 2017-12-27 NOTE — Progress Notes (Signed)
PROGRESS NOTE  [name] [name]  QIH:[id number] DOB: 08/21/1933 DOA: 12/22/2017 PCP: Patient, No Pcp Per   Brief Narrative: [name] [name] is an 84 y.o. male with a history of tobacco use, hyperlipidemia and schizophrenia who presented to the ED after being found down on porch by a neighbor. He reported generalized weakness, weight loss, chronic cough, and appeared mildly confused and weak but nonfocal. CXR demonstrated LUL mass, CK up to 782. CT head, chest, abdomen, pelvis confirmed LUL mass encroaching on pulmonary artery. Pulmonology was consulted, though goals of care discussions were carried out with patient and family, who have known of a lung cancer for 10 years and have consistently declined treatment. No intervention was recommended. Cardiology similarly consulted for elevated troponin in the absence of chest pain with echocardiogram showing no focal wall motion abnormalities but global hypokinesis with EF ~20%. Palliative care was consulted and family meeting took place 10/3. The patient will require placement and will have palliative services follow there.   Assessment & Plan: Principal Problem:   Weakness generalized Active Problems:   Lung mass   Tobacco abuse   Rhabdomyolysis   Protein-calorie malnutrition, severe   Encounter for palliative care   Goals of care, counseling/discussion  Rhabdomyolysis: CK improved with IV fluids. Unable to continue fluids with severely reduced LV function.  Generalized weakness: Likely from suspected malignancy, malnutrition, likely cause of and caused by rhabdo.  - PT recommends SNF. I anticipate ongoing failure to thrive with untreated malignancy which will require placement and may be forestalled by daily PT.  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  - Pulmonology consulted, discussed with family who  confirm the patient's wishes for conservative management.  - Palliative care performed family meeting 10/3, goal is for comfort and remaining functionally as independent as possible for as long as possible. No invasive procedures. MOST form completed by Dr. Einar Gip who would agree to be outpatient attending for patient if necessary.  Chronic HFrEF: EF 15-25%, diffuse hypokinesis.  - No diuretic with rhabdo, appears euvolemic.  NSTEMI, CAD based on CT chest: Troponins improving. - Cardiology consulted, recommending conservative management. ASA, beta blocker. With elevated CK, holding statin for now.   Severe protein-calorie malnutrition:  - Start regular diet - Dietitian consulted, protein supplementation  Hyperlipidemia:  - Holding statin   Tobacco use:  - Cessation counseling  History of schizophrenia: Not displaying any symptoms. No psychosis. Does not require treatment currently.  DVT prophylaxis: SCDs Code Status: DNR Family Communication: None at bedside this AM Disposition Plan: Awaiting SNF bed. Signed 30-day note, will continue with manual PASRR review.   Consultants:   Pulmonology  Cardiology  Palliative care team  Procedures:   Echocardiogram 12/23/2017: - Left ventricle: The cavity size was normal. Systolic function was   severely reduced. The estimated ejection fraction was in the   range of 15% to 25%. Diffuse hypokinesis. - Left atrium: The atrium was moderately dilated. - Atrial septum: No defect or patent foramen ovale was identified. - Pulmonary arteries: PA peak pressure: 38 mm Hg (S).  Impressions: - The right ventricular systolic pressure was increased consistent   with mild pulmonary hypertension.  Antimicrobials:  None   Subjective: No new complaints.  Objective: BP 114/72 (BP Location: Left Arm)   Pulse 67   Temp 98.3 F (36.8 C) (Oral)   Resp 18   Ht  6' (1.829 m)   Wt 70.3 kg   SpO2 96%   BMI 21.02 kg/m  Pleasant, elderly thin  male in no distress Clear and nonlabored RRR, no JVD or edema Alert, short term recall deficit, no focal deficits.  Linear thought process with no SI, HI, hallucinations.  Globally reduced muscle bulk.  Time spent: 15 minutes.  Patrecia Pour, MD [facility] [email or web address] Password [email or web address] 12/27/2017, 1:58 PM

## 2017-12-27 NOTE — Progress Notes (Addendum)
Patient's PASSR still pending manual review.   Face sheet, H&P, and 30 day short term nursing home stay sheet will need to be faxed or scanned to PASSR. 30 day will need doctor's signature. CSW paged physician through [facility]. Will fax/scan requested documentation once paperwork is signed.    Update: Documents signed and additional information faxed for review.   Stephanie Acre, [place] Social Worker [phone]

## 2017-12-28 LAB — GLUCOSE, CAPILLARY
Glucose-Capillary: 102 mg/dL — ABNORMAL HIGH (ref 70–99)
Glucose-Capillary: 90 mg/dL (ref 70–99)

## 2017-12-28 MED ORDER — METOPROLOL SUCCINATE ER 25 MG PO TB24: 25.0000 mg | ORAL_TABLET | Freq: Every day | ORAL | Status: DC

## 2017-12-28 MED ORDER — NICOTINE 21 MG/24HR TD PT24: 21.0000 mg | MEDICATED_PATCH | Freq: Every day | TRANSDERMAL | 0 refills | Status: DC

## 2017-12-28 MED ORDER — ASPIRIN 81 MG PO CHEW: 81.0000 mg | CHEWABLE_TABLET | Freq: Every day | ORAL | Status: DC

## 2017-12-28 NOTE — Progress Notes (Signed)
   12/28/17 1407  Clinical Encounter Type  Visited With Patient not available (Pt. sleeping.)  Visit Type Initial  Referral From Palliative care team  Consult/Referral To Chaplain  Chaplain attempted to visit Pt. on [place] list. Pt. was sleeping at time of visit. Chaplain was unable to introduce herself.

## 2017-12-28 NOTE — Clinical Social Work Placement (Signed)
CSW provided patient with SNF bed offers. Patient requested that CSW provide bed offers to his niece Jaylene Schrom [phone]) to make a SNF selection. CSW contacted patient's niece, no answer. CSW left voicemail requesting return phone call. CSW will need SNF selection to initiate [facility] insurance authorization.   CLINICAL SOCIAL WORK PLACEMENT  NOTE  Date:  12/28/2017  Patient Details  Name: [name] [name] MRN: [id number] Date of Birth: [birth year]  Clinical Social Work is seeking post-discharge placement for this patient at the [place] level of care (*CSW will initial, date and re-position this form in  chart as items are completed):  Yes   Patient/family provided with [facility] Work Department's list of facilities offering this level of care within the geographic area requested by the patient (or if unable, by the patient's family).  Yes   Patient/family informed of their freedom to choose among providers that offer the needed level of care, that participate in Medicare, Medicaid or managed care program needed by the patient, have an available bed and are willing to accept the patient.  Yes   Patient/family informed of [facility]'s ownership interest in [facility] and [facility], as well as of the fact that they are under no obligation to receive care at these facilities.  PASRR submitted to EDS on 12/25/17     PASRR number received on       Existing PASRR number confirmed on       FL2 transmitted to all facilities in geographic area requested by pt/family on 12/25/17     FL2 transmitted to all facilities within larger geographic area on       Patient informed that his/her managed care company has contracts with or will negotiate with certain facilities, including the following:        Yes   Patient/family informed of bed offers received.  Patient chooses bed at       Physician recommends and patient chooses bed at        Patient to be transferred to   on  .  Patient to be transferred to facility by       Patient family notified on   of transfer.  Name of family member notified:        PHYSICIAN       Additional Comment:    _______________________________________________ Burnis Medin, LCSW 12/28/2017, 10:14 AM

## 2017-12-28 NOTE — Care Management Important Message (Signed)
Important Message  Patient Details  Name: [name] [name] MRN: [id number] Date of Birth: 1933-10-19   Medicare Important Message Given:  Yes    Kerin Salen 12/28/2017, 2:24 [place] Message  Patient Details  Name: [name] [name] MRN: [id number] Date of Birth: 10-Nov-1933   Medicare Important Message Given:  Yes    Kerin Salen 12/28/2017, 2:24 PM

## 2017-12-28 NOTE — Progress Notes (Signed)
CSW contacted patient's niece [name] [name] [phone]) and provided bed offers. CSW explained that a SNF selection needs to be made to start insurance authorization [facility]) and that it is a lengthy process. Patient's niece agreed to contact CSW tomorrow before noon. CSW needs a SNF selection to continue with the placement process.  [name] [name], [place] Social Worker [facility] Cell#: [phone]

## 2017-12-28 NOTE — Progress Notes (Signed)
Physical Therapy Treatment Patient Details Name: [name] [name] MRN: [id number] DOB: February 05, 1934 Today's Date: 12/28/2017    History of Present Illness 84 y.o. male with history of tobacco abuse, hyperlipidemia and as per the previous chart schizophrenia presently taking only 2 medications as per the patient which we needs to be confirmed was brought to the ER after patient and neighbor found the patient was lying on the porch for long hours.  Pt admitted 12/22/17 with generalized weakness and also found to have left upper lobe mass encroaching onto the pulmonary artery    PT Comments    Assisted OOB to amb to bathroom without any AD but pt did need to steady self holding to door frame and foot board of bed.  Assisted with amb in hallway need for walker due to mild gait instability.  Limited distance due to 2/4 dyspnea.   Follow Up Recommendations  SNF     Equipment Recommendations       Recommendations for Other Services       Precautions / Restrictions Precautions Precautions: Fall Restrictions Weight Bearing Restrictions: No    Mobility  Bed Mobility Overal bed mobility: Needs Assistance Bed Mobility: Supine to Sit     Supine to sit: Supervision     General bed mobility comments: increased time, slow to move  Transfers Overall transfer level: Needs assistance Equipment used: Rolling walker (2 wheeled);None Transfers: Sit to/from [employer] to Stand: Min guard;Supervision Stand pivot transfers: Supervision;Min guard       General transfer comment: increased time, slow to move, delayed corrective reaction responce, Vc's safety with turns in bathroom  Ambulation/Gait Ambulation/Gait assistance: Min guard;Min assist Gait Distance (Feet): 55 Feet Assistive device: Rolling walker (2 wheeled);None Gait Pattern/deviations: Step-through pattern;Decreased stride length;Wide base of support Gait velocity: decreased    General Gait Details: amb  without AD in room to bathroom then used a RW in hallway for safety due to mild gait instability.  Avg RA 96% and HR 94   Stairs             Wheelchair Mobility    Modified Rankin (Stroke Patients Only)       Balance                                            Cognition Arousal/Alertness: Awake/alert Behavior During Therapy: Flat affect                                   General Comments: appropriate during session, follows commands  pleasant      Exercises      General Comments        Pertinent Vitals/Pain Pain Assessment: No/denies pain    Home Living                      Prior Function            PT Goals (current goals can now be found in the care plan section) Progress towards PT goals: Progressing toward goals    Frequency    Min 2X/week      PT Plan Current plan remains appropriate    Co-evaluation              AM-PAC PT "6 Clicks" Daily Activity  Outcome  Measure  Difficulty turning over in bed (including adjusting bedclothes, sheets and blankets)?: A Little Difficulty moving from lying on back to sitting on the side of the bed? : A Little Difficulty sitting down on and standing up from a chair with arms (e.g., wheelchair, bedside commode, etc,.)?: A Little Help needed moving to and from a bed to chair (including a wheelchair)?: A Little Help needed walking in hospital room?: A Little Help needed climbing 3-5 steps with a railing? : A Lot 6 Click Score: 17    End of Session Equipment Utilized During Treatment: Gait belt Activity Tolerance: Patient tolerated treatment well Patient left: in chair;with call bell/phone within reach Nurse Communication: Mobility status PT Visit Diagnosis: Unsteadiness on feet (R26.81)     Time: 4353-9122 PT Time Calculation (min) (ACUTE ONLY): 13 min  Charges:  $Gait Training: 8-22 mins                     Rica Koyanagi  PTA Acute  Rehabilitation  Services Pager      [phone] Office      [phone]

## 2017-12-28 NOTE — Discharge Summary (Addendum)
Physician Discharge Summary  [name] VOSLER OZH:[id number] DOB: 07/23/1933 DOA: 12/22/2017  PCP: Patient, No Pcp Per  Admit date: 12/22/2017 Discharge date: 12/31/2017  Admitted From: Home Disposition: Home (SNF declined)   Recommendations for Outpatient Follow-up:  1. Follow up with palliative care at the facility. 2. Follow up with Adrian Prows if needed. 3. Continue PT.  Home Health: PT/OT/RN/Aide (maximized due to denial of insurance for SNF) Equipment/Devices: Rolling walker Discharge Condition: Stable CODE STATUS: DNR Diet recommendation: Regular (to encourage po intake and given goals of care)  Brief/Interim Summary: [name] [name] is an 84 y.o. male with a history of tobacco use, hyperlipidemia and schizophrenia who presented to the ED after being found down on porch by a neighbor. He reported generalized weakness, weight loss, chronic cough, and appeared mildly confused and weak but nonfocal. CXR demonstrated LUL mass, CK up to 782. CT head, chest, abdomen, pelvis confirmed LUL mass encroaching on pulmonary artery. Pulmonology was consulted, though goals of care discussions were carried out with patient and family, who have known of a lung cancer for 10 years and have consistently declined treatment. No intervention was recommended. Cardiology similarly consulted for elevated troponin in the absence of chest pain with echocardiogram showing no focal wall motion abnormalities but global hypokinesis with EF ~20%. Palliative care was consulted and family meeting took place 10/3. The patient was discharged on 9/7 awaiting placement with plans to have palliative services follow there. Unfortunately insurance denied to authorize SNF placement despite peer-to-peer appeal on 10/9. He remains stable and will be discharged with maximal family and home health support.  Discharge Diagnoses:  Principal Problem:   Weakness generalized Active Problems:   Lung mass   Tobacco abuse    Rhabdomyolysis   Protein-calorie malnutrition, severe   Encounter for palliative care   Goals of care, counseling/discussion  Rhabdomyolysis: CK improved with IV fluids.   Generalized weakness: Likely from suspected malignancy, malnutrition, likely cause of and caused by rhabdo.  - PT recommends SNF. I anticipate ongoing failure to thrive with untreated malignancy which will require placement and may be forestalled by daily PT, though will need to be discharged home with no insurance coverage for placement.  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  - Pulmonology consulted, discussed with family who confirm the patient's wishes for conservative management.  - Palliative care performed family meeting 10/3, goal is for comfort and remaining functionally as independent as possible for as long as possible. No invasive procedures. MOST form completed by Dr. Einar Gip who would agree to be outpatient attending for patient if necessary.  Chronic HFrEF: EF 15-25%, diffuse hypokinesis.  - Continue home medications, appears euvolemic.  NSTEMI, CAD based on CT chest: Troponins improving. - Cardiology consulted, recommending conservative management. ASA, beta blocker, statin. Has tolerated these.  Severe protein-calorie malnutrition:  - Regular diet  Hyperlipidemia:  - Ok to restart statin now that rhabdo resolved. Will need ongoing goals of care discussions.  Tobacco use:  - Cessation counseling provided. Will continue nicotine patches.  History of schizophrenia: This is not an active issue. Not displaying any symptoms. No psychosis. Does not require treatment currently.  Discharge Instructions Discharge Instructions    Discharge instructions   Complete by:  As directed    You were admitted for a fall and found to have rhabdomyolysis and heart  failure in addition to the  lung cancer that was noticed in 2009. You do not desire treatment for this and you are stable medically for discharge. Plan was to discharge to a facility, though this was denied by your insurance despite your doctor appealing the decision. Therefore, you must be discharged home with maximal support recommended by family and home health agency.   There are new medications waiting at your pharmacy to optimize your heart function including aspirin, metoprolol, and nicotine patches in addition to your lipitor which you already have at home.  If your symptoms return, seek medical attention right away. Otherwise you can follow up with Dr. Einar Gip as needed.   Increase activity slowly   Complete by:  As directed      Allergies as of 12/31/2017   No Known Allergies     Medication List    TAKE these medications   aspirin 81 MG chewable tablet Chew 1 tablet (81 mg total) by mouth daily.   atorvastatin 10 MG tablet Commonly known as:  LIPITOR Take 10 mg by mouth daily.   metoprolol succinate 25 MG 24 hr tablet Commonly known as:  TOPROL-XL Take 1 tablet (25 mg total) by mouth daily.   nicotine 21 mg/24hr patch Commonly known as:  NICODERM CQ - dosed in mg/24 hours Place 1 patch (21 mg total) onto the skin daily.            Durable Medical Equipment  (From admission, onward)         Start     Ordered   12/31/17 0719  For home use only DME Walker rolling  Once    Question Answer Comment  Patient needs a walker to treat with the following condition Imbalance   Patient needs a walker to treat with the following condition At risk for falling      12/31/17 0718         Follow-up Information    Adrian Prows, MD .   Specialty:  Cardiology Contact information: [place] [address] [phone]          No Known Allergies  Consultations:  Pulmonology  Cardiology  Palliative care team  Procedures/Studies: Dg Chest 2  View  Result Date: 12/22/2017 CLINICAL DATA:  Weakness EXAM: CHEST - 2 VIEW COMPARISON:  CT chest dated 12/26/2008 FINDINGS: 6.2 cm mass in the left upper lobe. Right lung is clear. No pleural effusion or pneumothorax. The heart is normal in size. Visualized osseous structures are within normal limits. IMPRESSION: 6.2 cm mass in the left upper lobe, highly suspicious for primary bronchogenic neoplasm. Notably, a 2.6 cm nodule was present in the left upper lobe on prior CT, although presumably interval treatment has occurred. Consider CT chest with contrast for further evaluation. Electronically Signed   By: Julian Hy M.D.   On: 12/22/2017 21:14   Ct Head Wo Contrast  Result Date: 12/22/2017 CLINICAL DATA:  Weakness EXAM: CT HEAD WITHOUT CONTRAST TECHNIQUE: Contiguous axial images were obtained from the base of the skull through the vertex without intravenous contrast. COMPARISON:  11/29/2011 FINDINGS: Brain: No evidence of acute infarction, hemorrhage, hydrocephalus, extra-axial collection or mass lesion/mass effect. Mild cortical atrophy. Subcortical white matter and periventricular small vessel ischemic changes. Vascular: Intracranial atherosclerosis. Skull: Normal. Negative for fracture or focal lesion. Sinuses/Orbits: The visualized paranasal sinuses are essentially clear. The mastoid air cells are unopacified. Other: None. IMPRESSION: No evidence of acute intracranial abnormality. Mild atrophy with small vessel ischemic changes. Electronically Signed   By: Bertis Ruddy  [place] Pink M.D.   On: 12/22/2017 21:15   Ct Head W Contrast  Result Date: 12/23/2017 CLINICAL DATA:  84 y/o  M; staging of possible lung cancer. EXAM: CT HEAD WITH CONTRAST TECHNIQUE: Contiguous axial images were obtained from the base of the skull through the vertex with intravenous contrast. CONTRAST:  171mL ISOVUE-300 IOPAMIDOL (ISOVUE-300) INJECTION 61% COMPARISON:  12/22/2017 and 11/29/2011 CT head FINDINGS: Brain: No evidence of  acute infarction, hemorrhage, hydrocephalus, extra-axial collection or mass lesion/mass effect. Stable chronic microvascular ischemic changes and volume loss of the brain. Vascular: Calcific atherosclerosis of carotid siphons. No hyperdense vessel identified. Skull: Normal. Negative for fracture or focal lesion. Sinuses/Orbits: No acute finding. Other: None. IMPRESSION: 1. No evidence of metastatic disease to the brain or calvarium. 2. Stable chronic findings as above. Electronically Signed   By: Kristine Garbe M.D.   On: 12/23/2017 00:15   Ct Chest W Contrast  Result Date: 12/22/2017 CLINICAL DATA:  Abnormal chest radiograph, for staging EXAM: CT CHEST, ABDOMEN, AND PELVIS WITH CONTRAST TECHNIQUE: Multidetector CT imaging of the chest, abdomen and pelvis was performed following the standard protocol during bolus administration of intravenous contrast. CONTRAST:  149mL ISOVUE-300 IOPAMIDOL (ISOVUE-300) INJECTION 61% COMPARISON:  CT chest dated 12/26/2008 FINDINGS: CT CHEST FINDINGS Cardiovascular: The heart is normal in size. No pericardial effusion. No evidence of thoracic aortic aneurysm. Atherosclerotic calcifications of the aortic arch. Mild three-vessel coronary atherosclerosis. Mediastinum/Nodes: Small mediastinal lymph nodes, including a 7 mm short axis AP window node and an 8 mm short axis subcarinal node. Visualized thyroid is grossly unremarkable. Lungs/Pleura: 5.6 x 3.9 x 5.8 cm spiculated medial left upper lobe mass (series 6/image 37), transgressing the left fissure, and abutting the left suprahilar region. The mass invades the left upper lobe pulmonary artery (coronal image 74). Moderate centrilobular and paraseptal emphysematous changes. No focal consolidation. No pleural effusion or pneumothorax. Musculoskeletal: Visualized osseous structures are within normal limits. CT ABDOMEN PELVIS FINDINGS Hepatobiliary: Liver is notable for a 14 mm cyst in segment 4A (series 3/image 52).  Gallbladder is unremarkable. No intrahepatic or extrahepatic ductal dilatation. Pancreas: Within normal limits. Spleen: Within normal limits. Adrenals/Urinary Tract: Thickening of the left adrenal gland (series 3/image 52), nonspecific and unchanged from 2010. Right adrenal glands within normal limits. Bilateral renal cysts, including a dominant 5.4 cm cyst in the posterior left upper kidney (series 3/image 67). No hydronephrosis. Bladder is mildly thick-walled. Stomach/Bowel: Stomach is within normal limits. No evidence of bowel obstruction. Appendix is not discretely visualized. No colonic wall thickening or mass is seen. Vascular/Lymphatic: No evidence of abdominal aortic aneurysm. Atherosclerotic calcifications of the abdominal aorta and branch vessels. No suspicious abdominopelvic lymphadenopathy. Reproductive: Prostatomegaly, with enlargement of the central gland which indents the base of the bladder (series 3/image 113), suggesting BPH. Other: No abdominopelvic ascites. Tiny fat containing right inguinal hernia (series 3/image 120). Musculoskeletal: Mild degenerative changes of the lumbar spine. IMPRESSION: 5.8 cm spiculated medial left upper lobe mass, compatible with primary bronchogenic neoplasm. Mass transgresses the left fissure and abuts the left suprahilar region, invading the left upper lobe pulmonary artery. Small mediastinal lymph nodes, suspicious. Mild thickening of the left adrenal gland, unchanged from 2010, indeterminate but likely benign. Additional ancillary findings as above. Electronically Signed   By: Julian Hy M.D.   On: 12/22/2017 23:53   Ct Abdomen Pelvis W Contrast  Result Date: 12/22/2017 CLINICAL DATA:  Abnormal chest radiograph, for staging EXAM: CT CHEST, ABDOMEN, AND PELVIS WITH CONTRAST TECHNIQUE: Multidetector CT imaging of the  chest, abdomen and pelvis was performed following the standard protocol during bolus administration of intravenous contrast. CONTRAST:  119mL  ISOVUE-300 IOPAMIDOL (ISOVUE-300) INJECTION 61% COMPARISON:  CT chest dated 12/26/2008 FINDINGS: CT CHEST FINDINGS Cardiovascular: The heart is normal in size. No pericardial effusion. No evidence of thoracic aortic aneurysm. Atherosclerotic calcifications of the aortic arch. Mild three-vessel coronary atherosclerosis. Mediastinum/Nodes: Small mediastinal lymph nodes, including a 7 mm short axis AP window node and an 8 mm short axis subcarinal node. Visualized thyroid is grossly unremarkable. Lungs/Pleura: 5.6 x 3.9 x 5.8 cm spiculated medial left upper lobe mass (series 6/image 37), transgressing the left fissure, and abutting the left suprahilar region. The mass invades the left upper lobe pulmonary artery (coronal image 74). Moderate centrilobular and paraseptal emphysematous changes. No focal consolidation. No pleural effusion or pneumothorax. Musculoskeletal: Visualized osseous structures are within normal limits. CT ABDOMEN PELVIS FINDINGS Hepatobiliary: Liver is notable for a 14 mm cyst in segment 4A (series 3/image 52). Gallbladder is unremarkable. No intrahepatic or extrahepatic ductal dilatation. Pancreas: Within normal limits. Spleen: Within normal limits. Adrenals/Urinary Tract: Thickening of the left adrenal gland (series 3/image 52), nonspecific and unchanged from 2010. Right adrenal glands within normal limits. Bilateral renal cysts, including a dominant 5.4 cm cyst in the posterior left upper kidney (series 3/image 67). No hydronephrosis. Bladder is mildly thick-walled. Stomach/Bowel: Stomach is within normal limits. No evidence of bowel obstruction. Appendix is not discretely visualized. No colonic wall thickening or mass is seen. Vascular/Lymphatic: No evidence of abdominal aortic aneurysm. Atherosclerotic calcifications of the abdominal aorta and branch vessels. No suspicious abdominopelvic lymphadenopathy. Reproductive: Prostatomegaly, with enlargement of the central gland which indents the base  of the bladder (series 3/image 113), suggesting BPH. Other: No abdominopelvic ascites. Tiny fat containing right inguinal hernia (series 3/image 120). Musculoskeletal: Mild degenerative changes of the lumbar spine. IMPRESSION: 5.8 cm spiculated medial left upper lobe mass, compatible with primary bronchogenic neoplasm. Mass transgresses the left fissure and abuts the left suprahilar region, invading the left upper lobe pulmonary artery. Small mediastinal lymph nodes, suspicious. Mild thickening of the left adrenal gland, unchanged from 2010, indeterminate but likely benign. Additional ancillary findings as above. Electronically Signed   By: Julian Hy M.D.   On: 12/22/2017 23:53     Echocardiogram 12/23/2017: - Left ventricle: The cavity size was normal. Systolic function was severely reduced. The estimated ejection fraction was in the range of 15% to 25%. Diffuse hypokinesis. - Left atrium: The atrium was moderately dilated. - Atrial septum: No defect or patent foramen ovale was identified. - Pulmonary arteries: PA peak pressure: 38 mm Hg (S).  Impressions: - The right ventricular systolic pressure was increased consistent with mild pulmonary hypertension.  Subjective: He has no complaints, though is frustrated with insurance. Eating better and feeling stronger. No dyspnea or chest pain.  Discharge Exam: Vitals:   12/30/17 2105 12/31/17 0451  BP: (!) 102/56 122/69  Pulse: 72 69  Resp: 18   Temp: 97.6 F (36.4 C) 97.7 F (36.5 C)  SpO2: 93% 98%   General: Elderly in no distress Cardiovascular: RRR no JVD or edema Respiratory: Clear and nonlabored Abdominal: Soft, NT, ND, +BS  Labs:  CBG: Recent Labs  Lab 12/27/17 0129 12/27/17 0748 12/27/17 1641 12/28/17 0056 12/28/17 0712  GLUCAP 106* 88 100* 102* 90   Urinalysis    Component Value Date/Time   COLORURINE YELLOW 12/22/2017 2020   [place] 12/22/2017 2020   LABSPEC 1.005 12/22/2017 2020    PHURINE 5.0  12/22/2017 2020   GLUCOSEU NEGATIVE 12/22/2017 2020   HGBUR SMALL (A) 12/22/2017 2020   BILIRUBINUR NEGATIVE 12/22/2017 2020   KETONESUR NEGATIVE 12/22/2017 2020   PROTEINUR NEGATIVE 12/22/2017 2020   UROBILINOGEN 0.2 11/30/2011 0126   NITRITE NEGATIVE 12/22/2017 2020   LEUKOCYTESUR NEGATIVE 12/22/2017 2020    Time coordinating discharge: Approximately 40 minutes  Patrecia Pour, MD  [facility] 12/31/2017, 8:19 AM Pager [phone]

## 2017-12-29 MED ORDER — ENSURE ENLIVE PO LIQD
237.0000 mL | Freq: Two times a day (BID) | ORAL | Status: DC
  Administered 2017-12-30 – 2017-12-31 (×3): 237 mL via ORAL

## 2017-12-29 NOTE — Progress Notes (Signed)
Nutrition Follow-up  DOCUMENTATION CODES:   Severe malnutrition in context of social or environmental circumstances  INTERVENTION:  - Will order Ensure Enlive BID, each supplement provides 350 kcal and 20 grams of protein. - Continue to encourage PO intakes.    NUTRITION DIAGNOSIS:   Severe Malnutrition related to social / environmental circumstances as evidenced by moderate fat depletion, severe fat depletion, moderate muscle depletion, severe muscle depletion, energy intake < or equal to 50% for > or equal to 1 month. -ongoing  GOAL:   Patient will meet greater than or equal to 90% of their needs -minimally met on average  MONITOR:   PO intake, Weight trends, Labs  ASSESSMENT:   84 y.o. male with history of tobacco abuse, hyperlipidemia, and possible schizophrenia. He was transported to the ED by EMS after neighbors saw him laying on the porch for a prolonged period. He reported he usually lays on the porch but on that day was too weak to get back up. Has chronic cough denies any hemoptysis. He reported experiencing weight loss over the past few months. As per the ED physician, when the EMS reach patient's home patient was very unkempt and had urine all over him.  No new weight since admission. Diet advanced from NPO to Regular on 10/2 at 5:00 PM. He has mainly been eating 100% of meals since that time. Most recently, he ate 80% of breakfast, 100% of lunch and dinner on 10/6 and 100% of breakfast yesterday. Discharge order to SNF was placed yesterday. Social Worker's note from this AM states barriers to d/c are [employer] and PASRR pending and is under review.    Medications reviewed.  Labs reviewed; CBGs: 102 and 90 mg/dL today.    Diet Order:   Diet Order            Diet regular Room service appropriate? Yes; Fluid consistency: Thin  Diet effective now              EDUCATION NEEDS:   Not appropriate for education at this time  Skin:  Skin  Assessment: Reviewed RN Assessment  Last BM:  10/6  Height:   Ht Readings from Last 1 Encounters:  12/22/17 6' (1.829 m)    Weight:   Wt Readings from Last 1 Encounters:  12/22/17 70.3 kg    Ideal Body Weight:  80.91 kg  BMI:  Body mass index is 21.02 kg/m.  Estimated Nutritional Needs:   Kcal:  1965-2110 (28-30 kcal/kg)  Protein:  75-85 grams  Fluid:  >/= 2 L/day     Jarome Matin, MS, RD, LDN, [facility] Inpatient Clinical Dietitian Pager # [phone] After hours/weekend pager # [phone]

## 2017-12-29 NOTE — Progress Notes (Signed)
CSW followed up with patient's niece Gerritt Galentine). Patient's niece selected [facility]. CSW followed up with [facility] staff member Tammy, who confirmed bed offer and agreed to start insurance authorization.  Barriers to discharge - [place] pending under manual review  CSW will continue to follow and assist with discharge planning.  Abundio Miu, [place] Social Worker [facility] Cell#: [phone]

## 2017-12-29 NOTE — Progress Notes (Signed)
Patient seen and examined again this morning. He is feeling well without complaints. Eating better. No change to exam. He is thin but nontoxic with reduced muscle bulk but no focal deficits in strength or sensation. His mental status has progressively cleared and he is alert and oriented. He's certainly not displaying any evidence of active schizophrenia or mood disorder.  He is stable for discharge. Will continue current management. See DC summary from 10/7. CSW is working diligently on placement, but requires insurance auth and PASRR manual review.  Vance Gather, MD 12/29/2017 1:54 PM

## 2017-12-30 NOTE — Progress Notes (Signed)
Spoke with Dr. Jerene Pitch at length. Authorization for SNF declined. Will need to organize maximal home health services. Will anticipate DC 10/10 for time to arrange this with CM and with family. He has also been referred for their care manager to reach out to the patient to support him as much as possible.   Vance Gather, MD 12/30/2017 5:19 PM

## 2017-12-30 NOTE — Progress Notes (Signed)
Awaiting call back from physician, Dr. Eloise Levels, who denied insurance authorization for [name] [name]. Reportedly the deadline is 4:30pm EDT.  Vance Gather, MD 12/30/2017 3:13 PM

## 2017-12-30 NOTE — Progress Notes (Signed)
CSW informed by [place] SN[facility] that patient's insurance authorization [facility]) was denied. Staff reported that patient's MD has the option to do a peer to peer by 4:30PM.   CSW updated patient's attending MD and provided contact information.   CSW will continue to follow and assist with discharge planning.   Abundio Miu, [place] Social Worker [facility] Cell#: [phone]

## 2017-12-30 NOTE — Clinical Social Work Placement (Signed)
Patient received and accepted bed offer at [facility].Facility initiated [employer]) insurance authorization on 12/29/2017, CSW followed up with staff who reported insurance authorization is currently pending. Patient needs insurance authorization to discharge to SNF. CSW will continue to follow and assist with discharge planning.  CLINICAL SOCIAL WORK PLACEMENT  NOTE  Date:  12/30/2017  Patient Details  Name: [name] [name] MRN: [id number] Date of Birth: 14-Jun-1933  Clinical Social Work is seeking post-discharge placement for this patient at the [place] level of care (*CSW will initial, date and re-position this form in  chart as items are completed):  Yes   Patient/family provided with [place] Work Department's list of facilities offering this level of care within the geographic area requested by the patient (or if unable, by the patient's family).  Yes   Patient/family informed of their freedom to choose among providers that offer the needed level of care, that participate in Medicare, Medicaid or managed care program needed by the patient, have an available bed and are willing to accept the patient.  Yes   Patient/family informed of [place]'s ownership interest in [facility] and [facility], as well as of the fact that they are under no obligation to receive care at these facilities.  PASRR submitted to EDS on 12/25/17     PASRR number received on 12/29/17     Existing PASRR number confirmed on       FL2 transmitted to all facilities in geographic area requested by pt/family on 12/25/17     FL2 transmitted to all facilities within larger geographic area on       Patient informed that his/her managed care company has contracts with or will negotiate with certain facilities, including the following:        Yes   Patient/family informed of bed offers received.  Patient chooses bed at [place]      Physician recommends and patient chooses bed at      Patient to be transferred to   on  .  Patient to be transferred to facility by       Patient family notified on   of transfer.  Name of family member notified:        PHYSICIAN       Additional Comment:    _______________________________________________ Burnis Medin, LCSW 12/30/2017, 12:10 PM

## 2017-12-30 NOTE — Progress Notes (Signed)
Physical Therapy Treatment Patient Details Name: [name] [name] MRN: [id number] DOB: 09-28-1933 Today's Date: 12/30/2017    History of Present Illness 84 y.o. male with history of tobacco abuse, hyperlipidemia and as per the previous chart schizophrenia presently taking only 2 medications as per the patient which we needs to be confirmed was brought to the ER after patient and neighbor found the patient was lying on the porch for long hours.  Pt admitted 12/22/17 with generalized weakness and also found to have left upper lobe mass encroaching onto the pulmonary artery    PT Comments    Progressing with mobility. Pt remains at risk for falls (see BERG balance score on 10/4). Continue to recommend ST SNF.   Follow Up Recommendations  SNF     Equipment Recommendations  Rolling walker with 5" wheels    Recommendations for Other Services       Precautions / Restrictions Precautions Precautions: Fall Restrictions Weight Bearing Restrictions: No    Mobility  Bed Mobility Overal bed mobility: Modified Independent                Transfers Overall transfer level: Needs assistance Equipment used: Rolling walker (2 wheeled) Transfers: Sit to/from Stand Sit to Stand: Min guard         General transfer comment: Close guard for safety. Mildly unsteady but no overt LOB  Ambulation/Gait Ambulation/Gait assistance: Min guard Gait Distance (Feet): 75 Feet Assistive device: Rolling walker (2 wheeled) Gait Pattern/deviations: Step-through pattern;Decreased stride length     General Gait Details: Attempted to have pt walk without a RW however pt declined stating "no.Marland KitchenMarland KitchenI need it for my balance." Close guard for safety. Fair gait speed. Mildly unsteady. Pt also took a few steps in room without RW but while "furniture walking"   Stairs             Wheelchair Mobility    Modified Rankin (Stroke Patients Only)       Balance Overall balance assessment: Needs  assistance;History of Falls           Standing balance-Leahy Scale: Fair                              Cognition Arousal/Alertness: Awake/alert Behavior During Therapy: Flat affect Overall Cognitive Status: No family/caregiver present to determine baseline cognitive functioning                                 General Comments: appropriate during session, follows commands,  pleasant      Exercises      General Comments        Pertinent Vitals/Pain Pain Assessment: No/denies pain    Home Living                      Prior Function            PT Goals (current goals can now be found in the care plan section) Progress towards PT goals: Progressing toward goals    Frequency    Min 2X/week      PT Plan Current plan remains appropriate    Co-evaluation              AM-PAC PT "6 Clicks" Daily Activity  Outcome Measure  Difficulty turning over in bed (including adjusting bedclothes, sheets and blankets)?: None Difficulty moving from lying on back to  sitting on the side of the bed? : None Difficulty sitting down on and standing up from a chair with arms (e.g., wheelchair, bedside commode, etc,.)?: A Little Help needed moving to and from a bed to chair (including a wheelchair)?: A Little Help needed walking in hospital room?: A Little Help needed climbing 3-5 steps with a railing? : A Little 6 Click Score: 20    End of Session Equipment Utilized During Treatment: Gait belt Activity Tolerance: Patient tolerated treatment well Patient left: in bed;with call bell/phone within reach;with bed alarm set   PT Visit Diagnosis: Unsteadiness on feet (R26.81);History of falling (Z91.81)     Time: 1010-1018 PT Time Calculation (min) (ACUTE ONLY): 8 min  Charges:  $Gait Training: 8-22 mins                       [place]on Anna, PT Acute Rehabilitation Services Pager: [phone] Office: [phone]

## 2017-12-30 NOTE — Progress Notes (Signed)
Patient seen and examined this morning. He ate breakfast and is progressing with mobility but remains functionally appropriate from skilled nursing facility placement. No changes to current orders. I appreciate CSW's work in placing this patient.  Vance Gather, MD 12/30/2017 11:36 AM

## 2017-12-31 MED ORDER — METOPROLOL SUCCINATE ER 25 MG PO TB24: 25.0000 mg | ORAL_TABLET | Freq: Every day | ORAL | 0 refills | Status: AC

## 2017-12-31 MED ORDER — ASPIRIN 81 MG PO CHEW: 81.0000 mg | CHEWABLE_TABLET | Freq: Every day | ORAL | 0 refills | Status: AC

## 2017-12-31 MED ORDER — NICOTINE 21 MG/24HR TD PT24: 21.0000 mg | MEDICATED_PATCH | Freq: Every day | TRANSDERMAL | 0 refills | Status: DC

## 2017-12-31 NOTE — Progress Notes (Signed)
CSW still following up to complete process for SNF prior to CM offering HHC-since Niece says patient has no place to go if d/c plan is home-MD/CSW notified.

## 2017-12-31 NOTE — Progress Notes (Signed)
CSW followed up with patient's niece and discussed patient's insurance authorization denial. Patient's niece reported that patient can not discharge home because they let patient's apartment go when the recommendation was for SNF. Patient's niece reported that patient can not stay with her because she lives in public housing. Patient's niece reported that the SNF reported that they will work with patient and that patient's payee is going to apply for medicaid today for [name] term care.  CSW followed up with [facility] SNF admissions staff member Tammy who reported that patient's family is in the process of completing a medicaid application and that they can accept patient under medicaid pending status with a 30 day letter of guarantee.   Patient's payee provided Medicaid worker contact information (Melissa Gentle [phone]) Case ID [id number].   CSW staffed case with CSW Surveyor, quantity, [place] Surveyor, quantity approved 30 day letter of guarantee.   CSW updated patient's niece [name] [name] [phone]). Patient's niece thanked CSW for assistance with SNF placement.   Patient to discharge to [facility] SNF for [name] term care under medicaid pending status.   [name] [name], [place] Social Worker [facility] Cell#: [phone]

## 2017-12-31 NOTE — Progress Notes (Signed)
CSW following to assist with discharge planning.  Per chart review, patient's insurance authorization was denied after appeal was completed.  CSW attempted to contact patient's niece to provide update, no answer. CSW left voicemail requesting return phone call.  Patient to now discharge home with home health services. Patient's RNCM aware of patient's home health needs.  CSW will attempt to reach patient's niece again to provide update.   Abundio Miu, [place] Social Worker [facility] Cell#: [phone]

## 2017-12-31 NOTE — Clinical Social Work Placement (Signed)
Patient received and accepted bed offer at [facility]. Facility aware of patient's discharge and confirmed bed offer. PTAR contacted, patient's family notified. Patient's RN can call report to [phone] Room 109A, packet complete. CSW signing off, no other needs identified at this time.  CLINICAL SOCIAL WORK PLACEMENT  NOTE  Date:  12/31/2017  Patient Details  Name: [name] [name] MRN: [id number] Date of Birth: [birth year]  Clinical Social Work is seeking post-discharge placement for this patient at the [place] level of care (*CSW will initial, date and re-position this form in  chart as items are completed):  Yes   Patient/family provided with [place] Work Department's list of facilities offering this level of care within the geographic area requested by the patient (or if unable, by the patient's family).  Yes   Patient/family informed of their freedom to choose among providers that offer the needed level of care, that participate in Medicare, Medicaid or managed care program needed by the patient, have an available bed and are willing to accept the patient.  Yes   Patient/family informed of [place]'s ownership interest in [facility] and [facility], as well as of the fact that they are under no obligation to receive care at these facilities.  PASRR submitted to EDS on 12/25/17     PASRR number received on 12/29/17     Existing PASRR number confirmed on       FL2 transmitted to all facilities in geographic area requested by pt/family on 12/25/17     FL2 transmitted to all facilities within larger geographic area on       Patient informed that his/her managed care company has contracts with or will negotiate with certain facilities, including the following:        Yes   Patient/family informed of bed offers received.  Patient chooses bed at [place]     Physician recommends and patient chooses  bed at      Patient to be transferred to [facility] on 12/31/17.  Patient to be transferred to facility by PTAR     Patient family notified on 12/31/17 of transfer.  Name of family member notified:  [name]     PHYSICIAN       Additional Comment:    _______________________________________________ Burnis Medin, LCSW 12/31/2017, 2:41 PM

## 2017-12-31 NOTE — Care Management Important Message (Signed)
Important Message  Patient Details  Name: JOSHUAJAMES MOEHRING MRN: [id number] Date of Birth: [birth year]   Medicare Important Message Given:  Yes    Kerin Salen 12/31/2017, 11:21 AMImportant Message  Patient Details  Name: BALIAN SCHALLER MRN: [id number] Date of Birth: 03-Feb-1934   Medicare Important Message Given:  Yes    Kerin Salen 12/31/2017, 11:21 AM

## 2018-01-01 ENCOUNTER — Encounter: Payer: Self-pay | Admitting: Adult Health

## 2018-01-01 ENCOUNTER — Non-Acute Institutional Stay (SKILLED_NURSING_FACILITY): Payer: Medicare HMO | Admitting: Adult Health

## 2018-01-01 DIAGNOSIS — I5022 Chronic systolic (congestive) heart failure: Secondary | ICD-10-CM

## 2018-01-01 DIAGNOSIS — C3492 Malignant neoplasm of unspecified part of left bronchus or lung: Secondary | ICD-10-CM

## 2018-01-01 DIAGNOSIS — I110 Hypertensive heart disease with heart failure: Secondary | ICD-10-CM | POA: Diagnosis not present

## 2018-01-01 DIAGNOSIS — M6282 Rhabdomyolysis: Secondary | ICD-10-CM

## 2018-01-01 DIAGNOSIS — R918 Other nonspecific abnormal finding of lung field: Secondary | ICD-10-CM | POA: Diagnosis not present

## 2018-01-01 DIAGNOSIS — Z720 Tobacco use: Secondary | ICD-10-CM

## 2018-01-01 DIAGNOSIS — E785 Hyperlipidemia, unspecified: Secondary | ICD-10-CM

## 2018-01-01 DIAGNOSIS — E43 Unspecified severe protein-calorie malnutrition: Secondary | ICD-10-CM

## 2018-01-01 HISTORY — DX: Hypertensive heart disease with heart failure: I11.0

## 2018-01-01 NOTE — Progress Notes (Signed)
Location:   [facility] Room Number: 109 A Place of Service:  SNF (31)   CODE STATUS: DNR  No Known Allergies  Chief Complaint  Patient presents with  . Hospitalization Follow-up    Hospital follow up    HPI:  He is an 84 year old man who was taken to the ED after being found on his porch. He was found to be in rhabdomyolysis his last Ck was 931. He was found to have a large left upper lobe mass into the pulmonary artery suspicious of bronchogenic neoplasm. He has had this for a prolonged period of time and has declined treatment. He was seen by palliative and will need to follow with them here. He denies any uncontrolled pain; no changes in appetite; no cough or shortness of breath. He will continue to be followed for his chronic illnesses including systolic heart failure; malnutrition and dyslipidemia. His goal at this time is to return back home. I an not certain if he will be able to achieve that goal.    Past Medical History:  Diagnosis Date  . HLD (hyperlipidemia)   . Nodule of upper lobe of left lung 2009   PET avid concerning for cancer, decision was made at this time for no further investigation as he would not want treatment     History reviewed. No pertinent surgical history.  Social History   Socioeconomic History  . Marital status: Single    Spouse name: Not on file  . Number of children: Not on file  . Years of education: Not on file  . Highest education level: Not on file  Occupational History  . Not on file  Social Needs  . Financial resource strain: Not on file  . Food insecurity:    Worry: Not on file    Inability: Not on file  . Transportation needs:    Medical: Not on file    Non-medical: Not on file  Tobacco Use  . Smoking status: Current Every Day Smoker    Packs/day: 0.50  . Smokeless tobacco: Never Used  Substance and Sexual Activity  . Alcohol use: Yes    Comment: beer  . Drug use: No  . Sexual activity: Not on file    Lifestyle  . Physical activity:    Days per week: Not on file    Minutes per session: Not on file  . Stress: Not on file  Relationships  . Social connections:    Talks on phone: Not on file    Gets together: Not on file    Attends religious service: Not on file    Active member of club or organization: Not on file    Attends meetings of clubs or organizations: Not on file    Relationship status: Not on file  . Intimate partner violence:    Fear of current or ex partner: Not on file    Emotionally abused: Not on file    Physically abused: Not on file    Forced sexual activity: Not on file  Other Topics Concern  . Not on file  Social History Narrative  . Not on file   Family History  Problem Relation Age of Onset  . Cancer Brother   . Cancer Daughter       VITAL SIGNS BP (!) 103/59   Pulse 67   Temp (!) 97 F (36.1 C)   Resp 18   Ht 6' (1.829 m)   Wt 155 lb (70.3 kg)  SpO2 95%   BMI 21.02 kg/m   Outpatient Encounter Medications as of 01/01/2018  Medication Sig  . aspirin 81 MG chewable tablet Chew 1 tablet (81 mg total) by mouth daily.  . metoprolol succinate (TOPROL-XL) 25 MG 24 hr tablet Take 1 tablet (25 mg total) by mouth daily.  . nicotine (NICODERM CQ - DOSED IN MG/24 HOURS) 21 mg/24hr patch Place 1 patch (21 mg total) onto the skin daily.  . NON FORMULARY Diet Type:  Regular diet, regular texture  . rosuvastatin (CRESTOR) 5 MG tablet Take 5 mg by mouth at bedtime.  . [DISCONTINUED] atorvastatin (LIPITOR) 10 MG tablet Take 10 mg by mouth daily.   No facility-administered encounter medications on file as of 01/01/2018.      SIGNIFICANT DIAGNOSTIC EXAMS  TODAY:   12-22-17: chest x-ray: 6.2 cm mass in the left upper lobe, highly suspicious for primary bronchogenic neoplasm. Notably, a 2.6 cm nodule was present in the left upper lobe on prior CT, although presumably interval treatment has occurred.  12-22-17: ct of head: No evidence of acute  intracranial abnormality. Mild atrophy with small vessel ischemic changes.  12-22-17: ct of chest abdomen and pelvis:  5.8 cm spiculated medial left upper lobe mass, compatible with primary bronchogenic neoplasm. Mass transgresses the left fissure and abuts the left suprahilar region, invading the left upper lobe pulmonary artery. Small mediastinal lymph nodes, suspicious. Mild thickening of the left adrenal gland, unchanged from 2010, indeterminate but likely benign.  12-23-17: 2-d echo: - Left ventricle: The cavity size was normal. Systolic function was severely reduced. The estimated ejection fraction was in the range of 15% to 25%. Diffuse hypokinesis. - Left atrium: The atrium was moderately dilated. - Atrial septum: No defect or patent foramen ovale was identified. - Pulmonary arteries: PA peak pressure: 38 mm Hg (S).   LABS REVIEWED TODAY:   12-22-17: wbc 8.3; hgb 13.4; hct 40.5; mcv 82.7; plt 352; glucose 120; bun 16; creat 1.01; k+ 4.6; na++ 138; ca 9.3 liver normal albumin 3.6  CK 782  12-23-17: wbc 7.4; hgb 13.4; hct 40.9; mcv 83.;5 plt 372; glucose 112; bun 14; creat 0.84; k+ 4.4; na++ 141; ca 9.4; ast 52; albumin 3.7 CK 1798 chol 103; ldl 68; trig 27; hdl 30  12-24-17: CK 931    Review of Systems  Constitutional: Negative for malaise/fatigue.  Respiratory: Negative for cough and shortness of breath.   Cardiovascular: Negative for chest pain, palpitations and leg swelling.  Gastrointestinal: Negative for abdominal pain, constipation and heartburn.  Musculoskeletal: Negative for back pain, joint pain and myalgias.  Skin: Negative.   Neurological: Negative for dizziness.  Psychiatric/Behavioral: The patient is not nervous/anxious.     Physical Exam  Constitutional: He is oriented to person, place, and time. No distress.  Frail   Neck: No thyromegaly present.  Cardiovascular: Normal rate, regular rhythm, normal heart sounds and intact distal pulses.  Pulmonary/Chest: Effort  normal. No respiratory distress.  Breath sounds diminished   Abdominal: Soft. Bowel sounds are normal. He exhibits no distension. There is no tenderness.  Musculoskeletal: Normal range of motion. He exhibits no edema.  Lymphadenopathy:    He has no cervical adenopathy.  Neurological: He is alert and oriented to person, place, and time.  Skin: Skin is warm and dry. He is not diaphoretic.  Psychiatric: He has a normal mood and affect.    ASSESSMENT/ PLAN:  TODAY:   1. Chronic systolic heart failure: is stable EF 15-25% (12-23-17): will continue toprol  xl 25 mg daily and will monitor   2. Hypertensive heart disease with heart failure: is stable b/p 103/59: will continue toprol xl 25 mg daily  3. Schizophrenia: is emotionally stable is currently not on medications will monitor   4. Dyslipidemia: is stable LDL 68 will stop his crestor at this time; due to his advanced age lung mass  5. Tobacco abuse: is stable is on nicotine patch 21 mg daily for 6 weeks then 14 mg for 6 weeks then 7 mg for 6 weeks then d/c   6. Lung mass with suspicious of bronchogenic cancer of left lung: has declined any treatment: will setup a palliative care consult.   7. Protein calorie malnutrition, severe: without change: albumin 3.7; will begin prostat 30 cc twice daily and ensure 120 ml twice daily  8.  Rhabdomyolysis: is stable will repeat CK and will monitor    Will check cmp and CK next draw     MD is aware of resident's narcotic use and is in agreement with current plan of care. We will attempt to wean resident as apropriate   Ok Edwards NP [facility] Adult Medicine  Contact [phone] Monday through Friday 8am- 5pm  After hours call [phone]

## 2018-01-04 ENCOUNTER — Encounter: Payer: Self-pay | Admitting: Internal Medicine

## 2018-01-04 ENCOUNTER — Non-Acute Institutional Stay (SKILLED_NURSING_FACILITY): Payer: Medicare HMO | Admitting: Internal Medicine

## 2018-01-04 DIAGNOSIS — M6282 Rhabdomyolysis: Secondary | ICD-10-CM | POA: Diagnosis not present

## 2018-01-04 DIAGNOSIS — R627 Adult failure to thrive: Secondary | ICD-10-CM | POA: Diagnosis not present

## 2018-01-04 DIAGNOSIS — C3492 Malignant neoplasm of unspecified part of left bronchus or lung: Secondary | ICD-10-CM | POA: Diagnosis not present

## 2018-01-04 DIAGNOSIS — R5381 Other malaise: Secondary | ICD-10-CM | POA: Diagnosis not present

## 2018-01-04 LAB — HEPATIC FUNCTION PANEL
ALK PHOS: 119 (ref 25–125)
ALT: 41 — AB (ref 10–40)
AST: 30 (ref 14–40)
Bilirubin, Total: 0.2

## 2018-01-04 LAB — BASIC METABOLIC PANEL
BUN: 26 — AB (ref 4–21)
Creatinine: 0.6 (ref 0.6–1.3)
Glucose: 101
Potassium: 5.1 (ref 3.4–5.3)
Sodium: 141 (ref 137–147)

## 2018-01-04 NOTE — Progress Notes (Signed)
Patient ID: JANET DECESARE, male   DOB: [birth year], 84 y.o.   MRN: [id number]   Provider:  DR Arletha Grippe Location:  [place] Room Number: 109 A Place of Service:  SNF (31)  PCP: Patient, No Pcp Per Patient Care Team: Patient, No Pcp Per as PCP - General (General Practice)  Extended Emergency Contact Information Primary Emergency Contact: Speigner,[name] Address: [address]          [place], [place] [address] [place] Phone: [phone] Relation: Other  Code Status:  DNR Goals of Care: Advanced Directive information Advanced Directives 01/04/2018  Does Patient Have a Medical Advance Directive? Yes  Type of Advance Directive Out of facility DNR (pink MOST or yellow form)  Does patient want to make changes to medical advance directive? No - Patient declined  Would patient like information on creating a medical advance directive? No - Patient declined  Pre-existing out of facility DNR order (yellow form or pink MOST form) Yellow form placed in chart (order not valid for inpatient use)  Some encounter information is confidential and restricted. Go to Review Flowsheets activity to see all data.      Chief Complaint  Patient presents with  . New Admit To SNF    Admission    HPI: Patient is a 84 y.o. male seen today for admission to SNF following hospital stay for generalized weakness, lung mass, tobacco abuse, rhabdomyolysis, severe protein calorie malnutrition, NSTEMI, chronic HFrEF 15-25%. He was brought to ED 2/2 genaralized weakness, chronic cough and weight loss. Pulmonary consulted. Pt and family has known of lung CA dx x 10 yrs and have consistently declined treatment. CT head, chest, abdomen/pelvis most notable for "5.8 cm spiculated medial left upper lobe mass, compatible with primary bronchogenic neoplasm; Mass transgresses the left fissure and abuts the left suprahilar region, invading the left upper lobe pulmonary artery;  Small mediastinal lymph nodes, suspicious". Cardio consulted 2/2 elevated Trp I. 2D echo revealed global hypokinesis with EF approx 20%.  Palliative care met with pt and family (desires comfort care with no invasive procedures; MOST form completed during admission). CK peaked 1798>>931; LDL 68; HDL 30; Hgb 13.4; glucose 120; albumin 3.6; AST 53>>38;  Cr 1.01; Trp I 1.99>>0.97; gluoce peaked 106>>90.  Today he reports no concerns. Weakness improving. No CP, SOB or palpitations. Tolerating PT/OT. He lives alone. No nursing issues. No falls.  Chronic systolic heart failure -  Stable; EF 15-25% (12-23-17): takes toprol xl 25 mg daily  HTN - stable on toprol xl 25 mg daily  Schizophrenia - mood stable without medications  Dyslipidemia - stable; LDL 68. Off crestor due to his advanced age lung mass  Tobacco abuse - has tapering nicotine patch >>21 mg daily for 6 weeks >> 14 mg for 6 weeks >> 7 mg for 6 weeks then d/c   Lung mass with suspicious of bronchogenic cancer of left lung - he declined any treatment; palliative care consult pending  Hx severe Protein calorie malnutrition - stable on nutritional supplements per facility protocol. Albumin 3.6   Past Medical History:  Diagnosis Date  . HLD (hyperlipidemia)   . Nodule of upper lobe of left lung 2009   PET avid concerning for cancer, decision was made at this time for no further investigation as he would not want treatment    History reviewed. No pertinent surgical history.  reports that he has been smoking. He has been smoking about 0.50 packs per day.  He has never used smokeless tobacco. He reports that he drinks alcohol. He reports that he does not use drugs. Social History   Socioeconomic History  . Marital status: Single    Spouse name: Not on file  . Number of children: Not on file  . Years of education: Not on file  . Highest education level: Not on file  Occupational History  . Not on file  Social Needs  . Financial resource  strain: Not on file  . Food insecurity:    Worry: Not on file    Inability: Not on file  . Transportation needs:    Medical: Not on file    Non-medical: Not on file  Tobacco Use  . Smoking status: Current Every Day Smoker    Packs/day: 0.50  . Smokeless tobacco: Never Used  Substance and Sexual Activity  . Alcohol use: Yes    Comment: beer  . Drug use: No  . Sexual activity: Not on file  Lifestyle  . Physical activity:    Days per week: Not on file    Minutes per session: Not on file  . Stress: Not on file  Relationships  . Social connections:    Talks on phone: Not on file    Gets together: Not on file    Attends religious service: Not on file    Active member of club or organization: Not on file    Attends meetings of clubs or organizations: Not on file    Relationship status: Not on file  . Intimate partner violence:    Fear of current or ex partner: Not on file    Emotionally abused: Not on file    Physically abused: Not on file    Forced sexual activity: Not on file  Other Topics Concern  . Not on file  Social History Narrative  . Not on file    Functional Status Survey:    Family History  Problem Relation Age of Onset  . Cancer Brother   . Cancer Daughter     Health Maintenance  Topic Date Due  . TETANUS/TDAP  01/03/2019 (Originally 09/05/1952)  . PNA vac Low Risk Adult (1 of 2 - PCV13) 01/03/2019 (Originally 09/04/2000)  . INFLUENZA VACCINE  Completed    No Known Allergies  Outpatient Encounter Medications as of 01/04/2018  Medication Sig  . aspirin 81 MG chewable tablet Chew 1 tablet (81 mg total) by mouth daily.  Marland Kitchen ENSURE (ENSURE) Take 120 mLs by mouth 2 (two) times daily between meals.  . metoprolol succinate (TOPROL-XL) 25 MG 24 hr tablet Take 1 tablet (25 mg total) by mouth daily.  . nicotine (NICODERM CQ - DOSED IN MG/24 HOURS) 21 mg/24hr patch Place 1 patch (21 mg total) onto the skin daily.  . NON FORMULARY Diet Type:  Regular diet, regular  texture  . Nutritional Supplements (PROMOD) LIQD Take 30 mLs by mouth 2 (two) times daily.  . [DISCONTINUED] rosuvastatin (CRESTOR) 5 MG tablet Take 5 mg by mouth at bedtime.   No facility-administered encounter medications on file as of 01/04/2018.     Review of Systems  Constitutional: Positive for fatigue.  Musculoskeletal: Positive for arthralgias and gait problem.  Neurological: Positive for weakness.  All other systems reviewed and are negative.   Vitals:   01/04/18 0815  BP: (!) 105/58  Pulse: (!) 5  Resp: 18  Temp: 97.7 F (36.5 C)  SpO2: 96%  Weight: 155 lb (70.3 kg)  Height: 6' (1.829 m)  Body mass index is 21.02 kg/m. Physical Exam  Constitutional: He is oriented to person, place, and time. He appears well-developed.  Frail appearing lying in bed in NAD  HENT:  Mouth/Throat: Oropharynx is clear and moist.  MMM; no oral thrush  Eyes: Pupils are equal, round, and reactive to light. No scleral icterus.  Neck: Neck supple. Carotid bruit is not present.  Cardiovascular: Normal rate, regular rhythm and intact distal pulses. Exam reveals no gallop and no friction rub.  Murmur (1/6 SEM) heard. no distal LE swelling. No calf TTP  Pulmonary/Chest: Effort normal. He has decreased breath sounds (b/l). He has no wheezes. He has no rales. He exhibits no tenderness.  Abdominal: Soft. Bowel sounds are normal. He exhibits no distension, no abdominal bruit, no pulsatile midline mass and no mass. There is no hepatomegaly. There is no tenderness. There is no rebound and no guarding. No hernia.  Musculoskeletal: He exhibits edema (small and large joints).  Lymphadenopathy:    He has no cervical adenopathy.  Neurological: He is alert and oriented to person, place, and time. He has normal reflexes.  Skin: Skin is warm and dry. No rash noted.  Psychiatric: He has a normal mood and affect. His behavior is normal. Thought content normal.    Labs reviewed: Basic Metabolic  Panel: Recent Labs    12/22/17 2156 12/23/17 0245  NA 138 141  K 4.6 4.4  CL 101 102  CO2 25 27  GLUCOSE 120* 112*  BUN 16 14  CREATININE 1.01 0.84  CALCIUM 9.3 9.4   Liver Function Tests: Recent Labs    12/22/17 2156 12/23/17 0245  AST 38 53*  ALT 24 23  ALKPHOS 120 121  BILITOT 0.9 0.7  PROT 7.3 7.5  ALBUMIN 3.6 3.7   No results for input(s): LIPASE, AMYLASE in the last 8760 hours. No results for input(s): AMMONIA in the last 8760 hours. CBC: Recent Labs    12/22/17 2156 12/23/17 0245  WBC 8.3 7.4  NEUTROABS  --  5.5  HGB 13.4 13.4  HCT 40.5 40.9  MCV 82.7 83.5  PLT 352 372   Cardiac Enzymes: Recent Labs    12/22/17 2156  12/23/17 0245 12/23/17 0616 12/23/17 1215 12/23/17 1750 12/24/17 0516  CKTOTAL 782*  --  1,798*  --   --   --  931*  TROPONINI  --    < > 1.99* 1.54* 1.22* 0.97*  --    < > = values in this interval not displayed.   BNP: Invalid input(s): POCBNP No results found for: HGBA1C No results found for: TSH No results found for: VITAMINB12 No results found for: FOLATE No results found for: IRON, TIBC, FERRITIN  Imaging and Procedures obtained prior to SNF admission: Dg Chest 2 View  Result Date: 12/22/2017 CLINICAL DATA:  Weakness EXAM: CHEST - 2 VIEW COMPARISON:  CT chest dated 12/26/2008 FINDINGS: 6.2 cm mass in the left upper lobe. Right lung is clear. No pleural effusion or pneumothorax. The heart is normal in size. Visualized osseous structures are within normal limits. IMPRESSION: 6.2 cm mass in the left upper lobe, highly suspicious for primary bronchogenic neoplasm. Notably, a 2.6 cm nodule was present in the left upper lobe on prior CT, although presumably interval treatment has occurred. Consider CT chest with contrast for further evaluation. Electronically Signed   By: Julian Hy M.D.   On: 12/22/2017 21:14   Ct Head Wo Contrast  Result Date: 12/22/2017 CLINICAL DATA:  Weakness EXAM: CT HEAD  WITHOUT CONTRAST TECHNIQUE:  Contiguous axial images were obtained from the base of the skull through the vertex without intravenous contrast. COMPARISON:  11/29/2011 FINDINGS: Brain: No evidence of acute infarction, hemorrhage, hydrocephalus, extra-axial collection or mass lesion/mass effect. Mild cortical atrophy. Subcortical white matter and periventricular small vessel ischemic changes. Vascular: Intracranial atherosclerosis. Skull: Normal. Negative for fracture or focal lesion. Sinuses/Orbits: The visualized paranasal sinuses are essentially clear. The mastoid air cells are unopacified. Other: None. IMPRESSION: No evidence of acute intracranial abnormality. Mild atrophy with small vessel ischemic changes. Electronically Signed   By: Julian Hy M.D.   On: 12/22/2017 21:15   Ct Head W Contrast  Result Date: 12/23/2017 CLINICAL DATA:  84 y/o  M; staging of possible lung cancer. EXAM: CT HEAD WITH CONTRAST TECHNIQUE: Contiguous axial images were obtained from the base of the skull through the vertex with intravenous contrast. CONTRAST:  16m ISOVUE-300 IOPAMIDOL (ISOVUE-300) INJECTION 61% COMPARISON:  12/22/2017 and 11/29/2011 CT head FINDINGS: Brain: No evidence of acute infarction, hemorrhage, hydrocephalus, extra-axial collection or mass lesion/mass effect. Stable chronic microvascular ischemic changes and volume loss of the brain. Vascular: Calcific atherosclerosis of carotid siphons. No hyperdense vessel identified. Skull: Normal. Negative for fracture or focal lesion. Sinuses/Orbits: No acute finding. Other: None. IMPRESSION: 1. No evidence of metastatic disease to the brain or calvarium. 2. Stable chronic findings as above. Electronically Signed   By: LKristine GarbeM.D.   On: 12/23/2017 00:15   Ct Chest W Contrast  Result Date: 12/22/2017 CLINICAL DATA:  Abnormal chest radiograph, for staging EXAM: CT CHEST, ABDOMEN, AND PELVIS WITH CONTRAST TECHNIQUE: Multidetector CT imaging of the chest, abdomen and pelvis  was performed following the standard protocol during bolus administration of intravenous contrast. CONTRAST:  1025mISOVUE-300 IOPAMIDOL (ISOVUE-300) INJECTION 61% COMPARISON:  CT chest dated 12/26/2008 FINDINGS: CT CHEST FINDINGS Cardiovascular: The heart is normal in size. No pericardial effusion. No evidence of thoracic aortic aneurysm. Atherosclerotic calcifications of the aortic arch. Mild three-vessel coronary atherosclerosis. Mediastinum/Nodes: Small mediastinal lymph nodes, including a 7 mm short axis AP window node and an 8 mm short axis subcarinal node. Visualized thyroid is grossly unremarkable. Lungs/Pleura: 5.6 x 3.9 x 5.8 cm spiculated medial left upper lobe mass (series 6/image 37), transgressing the left fissure, and abutting the left suprahilar region. The mass invades the left upper lobe pulmonary artery (coronal image 74). Moderate centrilobular and paraseptal emphysematous changes. No focal consolidation. No pleural effusion or pneumothorax. Musculoskeletal: Visualized osseous structures are within normal limits. CT ABDOMEN PELVIS FINDINGS Hepatobiliary: Liver is notable for a 14 mm cyst in segment 4A (series 3/image 52). Gallbladder is unremarkable. No intrahepatic or extrahepatic ductal dilatation. Pancreas: Within normal limits. Spleen: Within normal limits. Adrenals/Urinary Tract: Thickening of the left adrenal gland (series 3/image 52), nonspecific and unchanged from 2010. Right adrenal glands within normal limits. Bilateral renal cysts, including a dominant 5.4 cm cyst in the posterior left upper kidney (series 3/image 67). No hydronephrosis. Bladder is mildly thick-walled. Stomach/Bowel: Stomach is within normal limits. No evidence of bowel obstruction. Appendix is not discretely visualized. No colonic wall thickening or mass is seen. Vascular/Lymphatic: No evidence of abdominal aortic aneurysm. Atherosclerotic calcifications of the abdominal aorta and branch vessels. No suspicious  abdominopelvic lymphadenopathy. Reproductive: Prostatomegaly, with enlargement of the central gland which indents the base of the bladder (series 3/image 113), suggesting BPH. Other: No abdominopelvic ascites. Tiny fat containing right inguinal hernia (series 3/image 120). Musculoskeletal: Mild degenerative changes of the lumbar spine. IMPRESSION: 5.8 cm spiculated  medial left upper lobe mass, compatible with primary bronchogenic neoplasm. Mass transgresses the left fissure and abuts the left suprahilar region, invading the left upper lobe pulmonary artery. Small mediastinal lymph nodes, suspicious. Mild thickening of the left adrenal gland, unchanged from 2010, indeterminate but likely benign. Additional ancillary findings as above. Electronically Signed   By: Julian Hy M.D.   On: 12/22/2017 23:53   Ct Abdomen Pelvis W Contrast  Result Date: 12/22/2017 CLINICAL DATA:  Abnormal chest radiograph, for staging EXAM: CT CHEST, ABDOMEN, AND PELVIS WITH CONTRAST TECHNIQUE: Multidetector CT imaging of the chest, abdomen and pelvis was performed following the standard protocol during bolus administration of intravenous contrast. CONTRAST:  1110m ISOVUE-300 IOPAMIDOL (ISOVUE-300) INJECTION 61% COMPARISON:  CT chest dated 12/26/2008 FINDINGS: CT CHEST FINDINGS Cardiovascular: The heart is normal in size. No pericardial effusion. No evidence of thoracic aortic aneurysm. Atherosclerotic calcifications of the aortic arch. Mild three-vessel coronary atherosclerosis. Mediastinum/Nodes: Small mediastinal lymph nodes, including a 7 mm short axis AP window node and an 8 mm short axis subcarinal node. Visualized thyroid is grossly unremarkable. Lungs/Pleura: 5.6 x 3.9 x 5.8 cm spiculated medial left upper lobe mass (series 6/image 37), transgressing the left fissure, and abutting the left suprahilar region. The mass invades the left upper lobe pulmonary artery (coronal image 74). Moderate centrilobular and paraseptal  emphysematous changes. No focal consolidation. No pleural effusion or pneumothorax. Musculoskeletal: Visualized osseous structures are within normal limits. CT ABDOMEN PELVIS FINDINGS Hepatobiliary: Liver is notable for a 14 mm cyst in segment 4A (series 3/image 52). Gallbladder is unremarkable. No intrahepatic or extrahepatic ductal dilatation. Pancreas: Within normal limits. Spleen: Within normal limits. Adrenals/Urinary Tract: Thickening of the left adrenal gland (series 3/image 52), nonspecific and unchanged from 2010. Right adrenal glands within normal limits. Bilateral renal cysts, including a dominant 5.4 cm cyst in the posterior left upper kidney (series 3/image 67). No hydronephrosis. Bladder is mildly thick-walled. Stomach/Bowel: Stomach is within normal limits. No evidence of bowel obstruction. Appendix is not discretely visualized. No colonic wall thickening or mass is seen. Vascular/Lymphatic: No evidence of abdominal aortic aneurysm. Atherosclerotic calcifications of the abdominal aorta and branch vessels. No suspicious abdominopelvic lymphadenopathy. Reproductive: Prostatomegaly, with enlargement of the central gland which indents the base of the bladder (series 3/image 113), suggesting BPH. Other: No abdominopelvic ascites. Tiny fat containing right inguinal hernia (series 3/image 120). Musculoskeletal: Mild degenerative changes of the lumbar spine. IMPRESSION: 5.8 cm spiculated medial left upper lobe mass, compatible with primary bronchogenic neoplasm. Mass transgresses the left fissure and abuts the left suprahilar region, invading the left upper lobe pulmonary artery. Small mediastinal lymph nodes, suspicious. Mild thickening of the left adrenal gland, unchanged from 2010, indeterminate but likely benign. Additional ancillary findings as above. Electronically Signed   By: SJulian HyM.D.   On: 12/22/2017 23:53    Assessment/Plan   ICD-10-CM   1. Physical deconditioning R53.81   2.  Non-traumatic rhabdomyolysis M62.82   3. FTT (failure to thrive) in adult R62.7   4. 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 Prognosis is guarded 2/2 presence of lung mass, etiology unknown. Communicated with pt and nursing.  Will follow  Labs/tests ordered: CK, cmp  Phuong Hillary S. CEulas Post DBurna Cash, F.  [facility] [place], [place] [address] [phone] Cell (Monday-Friday 8 AM - 5 PM) [phone] After 5 PM and follow prompts

## 2018-01-08 ENCOUNTER — Encounter: Payer: Self-pay | Admitting: Adult Health

## 2018-01-08 ENCOUNTER — Non-Acute Institutional Stay (SKILLED_NURSING_FACILITY): Payer: Medicare HMO | Admitting: Adult Health

## 2018-01-08 DIAGNOSIS — I110 Hypertensive heart disease with heart failure: Secondary | ICD-10-CM | POA: Diagnosis not present

## 2018-01-08 DIAGNOSIS — F209 Schizophrenia, unspecified: Secondary | ICD-10-CM

## 2018-01-08 DIAGNOSIS — I5022 Chronic systolic (congestive) heart failure: Secondary | ICD-10-CM | POA: Diagnosis not present

## 2018-01-08 DIAGNOSIS — E785 Hyperlipidemia, unspecified: Secondary | ICD-10-CM

## 2018-01-08 NOTE — Progress Notes (Signed)
Location:   [facility] Room Number: 109 Place of Service:  SNF (31)   CODE STATUS: dnr  No Known Allergies  Chief Complaint  Patient presents with  . Medical Management of Chronic Issues    Chronic systolic heart failure; dyslipidemia; schizophrenia, chronic condition; hypertensive heart disease with heart failure. Weekly follow up for the first 30 days post hospitalization.     HPI:  He is a 84 year old short term rehab patient being seen for the management of his chronic illnesses: chf; hypertension; dyslipidemia; schizophrenia. He denies any shortness of breath; no leg swelling; no cough; no complaints of pain.   Past Medical History:  Diagnosis Date  . HLD (hyperlipidemia)   . Nodule of upper lobe of left lung 2009   PET avid concerning for cancer, decision was made at this time for no further investigation as he would not want treatment     History reviewed. No pertinent surgical history.  Social History   Socioeconomic History  . Marital status: Single    Spouse name: Not on file  . Number of children: Not on file  . Years of education: Not on file  . Highest education level: Not on file  Occupational History  . Not on file  Social Needs  . Financial resource strain: Not on file  . Food insecurity:    Worry: Not on file    Inability: Not on file  . Transportation needs:    Medical: Not on file    Non-medical: Not on file  Tobacco Use  . Smoking status: Current Every Day Smoker    Packs/day: 0.50  . Smokeless tobacco: Never Used  Substance and Sexual Activity  . Alcohol use: Yes    Comment: beer  . Drug use: No  . Sexual activity: Not on file  Lifestyle  . Physical activity:    Days per week: Not on file    Minutes per session: Not on file  . Stress: Not on file  Relationships  . Social connections:    Talks on phone: Not on file    Gets together: Not on file    Attends religious service: Not on file    Active member of club  or organization: Not on file    Attends meetings of clubs or organizations: Not on file    Relationship status: Not on file  . Intimate partner violence:    Fear of current or ex partner: Not on file    Emotionally abused: Not on file    Physically abused: Not on file    Forced sexual activity: Not on file  Other Topics Concern  . Not on file  Social History Narrative  . Not on file   Family History  Problem Relation Age of Onset  . Cancer Brother   . Cancer Daughter       VITAL SIGNS BP 110/66   Pulse 86   Temp (!) 97.3 F (36.3 C)   Resp 18   Ht 6' (1.829 m)   Wt 138 lb 6.4 oz (62.8 kg)   SpO2 95%   BMI 18.77 kg/m   Outpatient Encounter Medications as of 01/08/2018  Medication Sig  . aspirin 81 MG chewable tablet Chew 1 tablet (81 mg total) by mouth daily.  Marland Kitchen ENSURE (ENSURE) Take 120 mLs by mouth 2 (two) times daily between meals.  . metoprolol succinate (TOPROL-XL) 25 MG 24 hr tablet Take 1 tablet (25 mg total) by mouth  daily.  . nicotine (NICODERM CQ - DOSED IN MG/24 HOURS) 21 mg/24hr patch Place 1 patch (21 mg total) onto the skin daily.  . NON FORMULARY Diet Type:  Regular diet, regular texture  . Nutritional Supplements (PROMOD) LIQD Take 30 mLs by mouth 2 (two) times daily.   No facility-administered encounter medications on file as of 01/08/2018.      SIGNIFICANT DIAGNOSTIC EXAMS  PREVIOUS:   12-22-17: chest x-ray: 6.2 cm mass in the left upper lobe, highly suspicious for primary bronchogenic neoplasm. Notably, a 2.6 cm nodule was present in the left upper lobe on prior CT, although presumably interval treatment has occurred.  12-22-17: ct of head: No evidence of acute intracranial abnormality. Mild atrophy with small vessel ischemic changes.  12-22-17: ct of chest abdomen and pelvis:  5.8 cm spiculated medial left upper lobe mass, compatible with primary bronchogenic neoplasm. Mass transgresses the left fissure and abuts the left suprahilar region,  invading the left upper lobe pulmonary artery. Small mediastinal lymph nodes, suspicious. Mild thickening of the left adrenal gland, unchanged from 2010, indeterminate but likely benign.  12-23-17: 2-d echo: - Left ventricle: The cavity size was normal. Systolic function was severely reduced. The estimated ejection fraction was in the range of 15% to 25%. Diffuse hypokinesis. - Left atrium: The atrium was moderately dilated. - Atrial septum: No defect or patent foramen ovale was identified. - Pulmonary arteries: PA peak pressure: 38 mm Hg (S).  NO NEW EXAMS.    LABS REVIEWED PREVIOUS:   12-22-17: wbc 8.3; hgb 13.4; hct 40.5; mcv 82.7; plt 352; glucose 120; bun 16; creat 1.01; k+ 4.6; na++ 138; ca 9.3 liver normal albumin 3.6  CK 782  12-23-17: wbc 7.4; hgb 13.4; hct 40.9; mcv 83.;5 plt 372; glucose 112; bun 14; creat 0.84; k+ 4.4; na++ 141; ca 9.4; ast 52; albumin 3.7 CK 1798 chol 103; ldl 68; trig 27; hdl 30  12-24-17: CK 931   TODAY:   01-04-18: glucose 101; bun 25.7; creat 0.61; k+ 5.1; na++ 141; ca 9.3; liver normal albumin 3.8; CK 111    Review of Systems  Constitutional: Negative for malaise/fatigue.  Respiratory: Negative for cough and shortness of breath.   Cardiovascular: Negative for chest pain, palpitations and leg swelling.  Gastrointestinal: Negative for abdominal pain, constipation and heartburn.  Musculoskeletal: Negative for back pain, joint pain and myalgias.  Skin: Negative.   Neurological: Negative for dizziness.  Psychiatric/Behavioral: The patient is not nervous/anxious.     Physical Exam  Constitutional: He is oriented to person, place, and time. No distress.  Frail   Neck: No thyromegaly present.  Cardiovascular: Normal rate, regular rhythm, normal heart sounds and intact distal pulses.  Pulmonary/Chest: Effort normal. No respiratory distress.  Breath sounds diminished   Abdominal: Soft. Bowel sounds are normal. He exhibits no distension. There is no  tenderness.  Musculoskeletal: Normal range of motion. He exhibits no edema.  Using wheelchair   Lymphadenopathy:    He has no cervical adenopathy.  Neurological: He is alert and oriented to person, place, and time.  Skin: Skin is warm and dry. He is not diaphoretic.  Psychiatric: He has a normal mood and affect.    ASSESSMENT/ PLAN:  TODAY:   1. Chronic systolic heart failure: is stable EF 15-25% (12-23-17): will continue toprol xl 25 mg daily and will monitor   2. Hypertensive heart disease with heart failure: is stable b/p 110/66: will continue toprol xl 25 mg daily  3. Schizophrenia, chronic condition:  is emotionally stable is currently not on medications will monitor   4. Dyslipidemia: is stable LDL 68 is off statin  due to his advanced age lung mass  PREVIOUS   5. Tobacco abuse: is stable is on nicotine patch 21 mg daily for 6 weeks then 14 mg for 6 weeks then 7 mg for 6 weeks then d/c   6. Lung mass with suspicious of bronchogenic cancer of left lung: has declined any treatment: will setup a palliative care consult.   7. Protein calorie malnutrition, severe: without change: albumin 3.8; will continue prostat 30 cc twice daily and ensure 120 ml twice daily  8.  Rhabdomyolysis: is stable CK 111; has resolved.        MD is aware of resident's narcotic use and is in agreement with current plan of care. We will attempt to wean resident as apropriate   Ok Edwards NP [facility]  Contact [phone] Monday through Friday 8am- 5pm  After hours call [phone]

## 2018-01-20 ENCOUNTER — Encounter: Payer: Self-pay | Admitting: Adult Health

## 2018-01-20 ENCOUNTER — Non-Acute Institutional Stay (SKILLED_NURSING_FACILITY): Payer: Medicare HMO | Admitting: Adult Health

## 2018-01-20 DIAGNOSIS — C3492 Malignant neoplasm of unspecified part of left bronchus or lung: Secondary | ICD-10-CM

## 2018-01-20 DIAGNOSIS — Z720 Tobacco use: Secondary | ICD-10-CM

## 2018-01-20 DIAGNOSIS — E43 Unspecified severe protein-calorie malnutrition: Secondary | ICD-10-CM | POA: Diagnosis not present

## 2018-01-20 NOTE — Progress Notes (Signed)
Location:   [facility] Room Number: 109 Place of Service:  SNF (31)   CODE STATUS: DNR  No Known Allergies  Chief Complaint  Patient presents with  . Medical Management of Chronic Issues    Bronchogenic cancer of left lung; protein-calorie malnutrition; severe; tobacco abuse; weekly follow up for the first 30 days post hospitalization. Care plan meeting.     HPI:  He is a 84 year old short term rehab patient being seen for the management of his chronic illnesses; bronchogenic cancer of left lung; malnutrition and tobacco abuse. We have come together ofr his routine care plan meeting. His goal is to return to his independent living apartment in the next one to two weeks. He would benefit from cna at home. He wil need a walker 3:1 commode and tub transfer bench. He denies hearing any voices; no changes in appetite no uncontrolled pain.   Past Medical History:  Diagnosis Date  . HLD (hyperlipidemia)   . Hypertensive heart disease with heart failure ([place]) 01/01/2018  . Nodule of upper lobe of left lung 2009   PET avid concerning for cancer, decision was made at this time for no further investigation as he would not want treatment     History reviewed. No pertinent surgical history.  Social History   Socioeconomic History  . Marital status: Single    Spouse name: Not on file  . Number of children: Not on file  . Years of education: Not on file  . Highest education level: Not on file  Occupational History  . Not on file  Social Needs  . Financial resource strain: Not on file  . Food insecurity:    Worry: Not on file    Inability: Not on file  . Transportation needs:    Medical: Not on file    Non-medical: Not on file  Tobacco Use  . Smoking status: Current Every Day Smoker    Packs/day: 0.50  . Smokeless tobacco: Never Used  Substance and Sexual Activity  . Alcohol use: Yes    Comment: beer  . Drug use: No  . Sexual activity: Not on file  Lifestyle   . Physical activity:    Days per week: Not on file    Minutes per session: Not on file  . Stress: Not on file  Relationships  . Social connections:    Talks on phone: Not on file    Gets together: Not on file    Attends religious service: Not on file    Active member of club or organization: Not on file    Attends meetings of clubs or organizations: Not on file    Relationship status: Not on file  . Intimate partner violence:    Fear of current or ex partner: Not on file    Emotionally abused: Not on file    Physically abused: Not on file    Forced sexual activity: Not on file  Other Topics Concern  . Not on file  Social History Narrative  . Not on file   Family History  Problem Relation Age of Onset  . Cancer Brother   . Cancer Daughter       VITAL SIGNS BP 112/70   Pulse 78   Temp 97.8 F (36.6 C)   Resp 18   Ht 6' (1.829 m)   Wt 145 lb 3.2 oz (65.9 kg)   SpO2 96%   BMI 19.69 kg/m   Outpatient Encounter Medications as of  01/20/2018  Medication Sig  . aspirin 81 MG chewable tablet Chew 1 tablet (81 mg total) by mouth daily.  Marland Kitchen ENSURE (ENSURE) Take 120 mLs by mouth 2 (two) times daily between meals.  . metoprolol succinate (TOPROL-XL) 25 MG 24 hr tablet Take 1 tablet (25 mg total) by mouth daily.  . nicotine (NICODERM CQ - DOSED IN MG/24 HOURS) 21 mg/24hr patch Place 1 patch (21 mg total) onto the skin daily.  . NON FORMULARY Diet Type:  Regular diet, regular texture  . Nutritional Supplements (PROMOD) LIQD Take 30 mLs by mouth 2 (two) times daily.   No facility-administered encounter medications on file as of 01/20/2018.      SIGNIFICANT DIAGNOSTIC EXAMS  PREVIOUS:   12-22-17: chest x-ray: 6.2 cm mass in the left upper lobe, highly suspicious for primary bronchogenic neoplasm. Notably, a 2.6 cm nodule was present in the left upper lobe on prior CT, although presumably interval treatment has occurred.  12-22-17: ct of head: No evidence of acute  intracranial abnormality. Mild atrophy with small vessel ischemic changes.  12-22-17: ct of chest abdomen and pelvis:  5.8 cm spiculated medial left upper lobe mass, compatible with primary bronchogenic neoplasm. Mass transgresses the left fissure and abuts the left suprahilar region, invading the left upper lobe pulmonary artery. Small mediastinal lymph nodes, suspicious. Mild thickening of the left adrenal gland, unchanged from 2010, indeterminate but likely benign.  12-23-17: 2-d echo: - Left ventricle: The cavity size was normal. Systolic function was severely reduced. The estimated ejection fraction was in the range of 15% to 25%. Diffuse hypokinesis. - Left atrium: The atrium was moderately dilated. - Atrial septum: No defect or patent foramen ovale was identified. - Pulmonary arteries: PA peak pressure: 38 mm Hg (S).  NO NEW EXAMS.    LABS REVIEWED PREVIOUS:   12-22-17: wbc 8.3; hgb 13.4; hct 40.5; mcv 82.7; plt 352; glucose 120; bun 16; creat 1.01; k+ 4.6; na++ 138; ca 9.3 liver normal albumin 3.6  CK 782  12-23-17: wbc 7.4; hgb 13.4; hct 40.9; mcv 83.;5 plt 372; glucose 112; bun 14; creat 0.84; k+ 4.4; na++ 141; ca 9.4; ast 52; albumin 3.7 CK 1798 chol 103; ldl 68; trig 27; hdl 30  12-24-17: CK 931  01-04-18: glucose 101; bun 25.7; creat 0.61; k+ 5.1; na++ 141; ca 9.3; liver normal albumin 3.8; CK 111   NO NEW LABS.    Review of Systems  Constitutional: Negative for malaise/fatigue.  Respiratory: Negative for cough and shortness of breath.   Cardiovascular: Negative for chest pain, palpitations and leg swelling.  Gastrointestinal: Negative for abdominal pain, constipation and heartburn.  Musculoskeletal: Negative for back pain, joint pain and myalgias.  Skin: Negative.   Neurological: Negative for dizziness.  Psychiatric/Behavioral: The patient is not nervous/anxious.     Physical Exam  Constitutional: He is oriented to person, place, and time. No distress.  Frail     Neck: No thyromegaly present.  Cardiovascular: Normal rate, regular rhythm, normal heart sounds and intact distal pulses.  Pulmonary/Chest: Effort normal and breath sounds normal. No respiratory distress.  Abdominal: Soft. Bowel sounds are normal. He exhibits no distension. There is no tenderness.  Musculoskeletal: He exhibits no edema.  Is able to move all extremities Has slight lower extremity weakness   Lymphadenopathy:    He has no cervical adenopathy.  Neurological: He is alert and oriented to person, place, and time.  Skin: Skin is warm and dry. He is not diaphoretic.  Psychiatric: He has  a normal mood and affect.    ASSESSMENT/ PLAN:  TODAY:   1. Tobacco abuse: is stable is on nicotine patch 21 mg daily for 6 weeks then 14 mg for 6 weeks then 7 mg for 6 weeks then d/c   2. Lung mass with suspicious of bronchogenic cancer of left lung: has declined any treatment: will setup a palliative care consult.   3. Protein calorie malnutrition, severe: without change: albumin 3.8; will continue prostat 30 cc twice daily and ensure 120 ml twice daily  PREVIOUS    4. Chronic systolic heart failure: is stable EF 15-25% (12-23-17): will continue toprol xl 25 mg daily and will monitor   5. Hypertensive heart disease with heart failure: is stable b/p 112/70: will continue toprol xl 25 mg daily  6. Schizophrenia, chronic condition: is emotionally stable is currently not on medications will monitor has been on latuda in the past.   7. Dyslipidemia: is stable LDL 68 is off statin  due to his advanced age lung mass    MD is aware of resident's narcotic use and is in agreement with current plan of care. We will attempt to wean resident as apropriate   Ok Edwards NP [facility]  Contact [phone] Monday through Friday 8am- 5pm  After hours call [phone]

## 2019-10-19 IMAGING — CT CT HEAD W/ CM
3 series · 15 of 47 positions shown, 18 images · IV contrast (ISOVUE)
Comparison: 12/22/2017 and 11/29/2011 CT head

CLINICAL DATA: 84 y/o  M; staging of possible lung cancer.

EXAM:
CT HEAD WITH CONTRAST
TECHNIQUE: Contiguous axial images were obtained from the base of the skull
through the vertex with intravenous contrast.
CONTRAST:  100mL [id number] IOPAMIDOL ([id number]) INJECTION 61%

[Series 3: head w · axial · 0.47mm/px · z∈[+414,+549]mm · 9 of 33 slices shown, 12 images]
[im 3/33  brain]
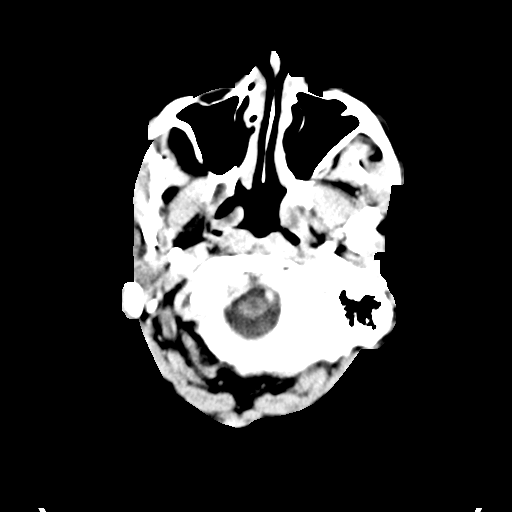
[im 3/33  bone]
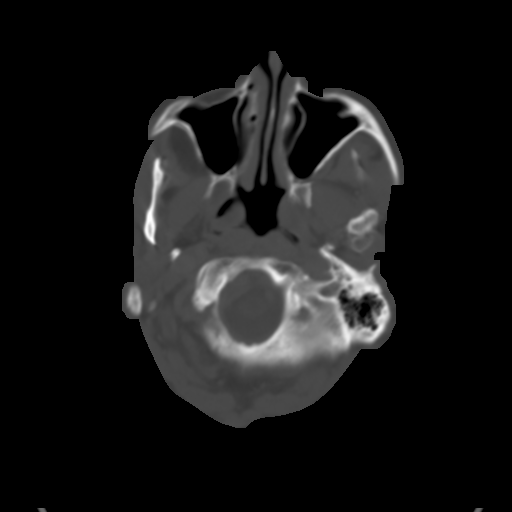
[im 6/33  brain]
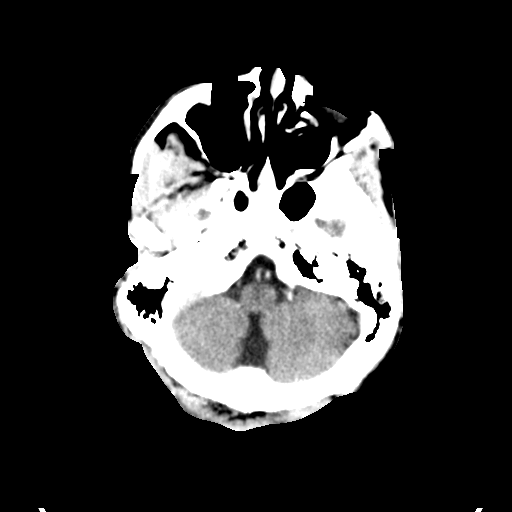
[im 9/33  brain]
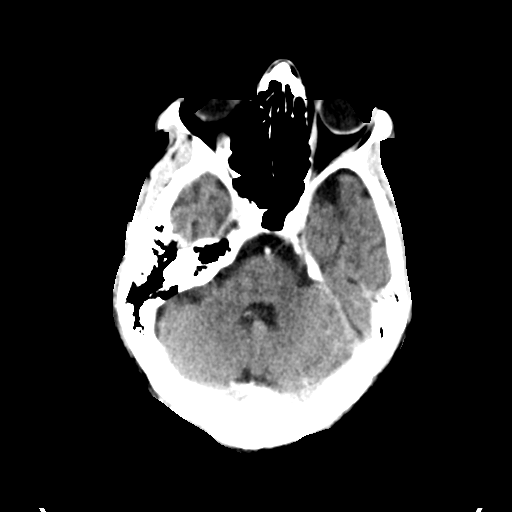
[im 13/33  brain]
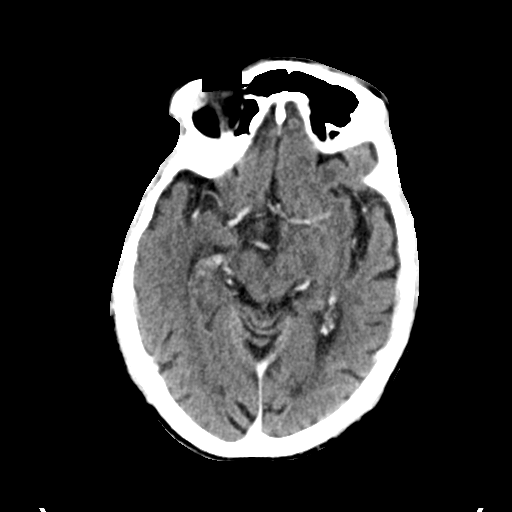
[im 17/33  brain]
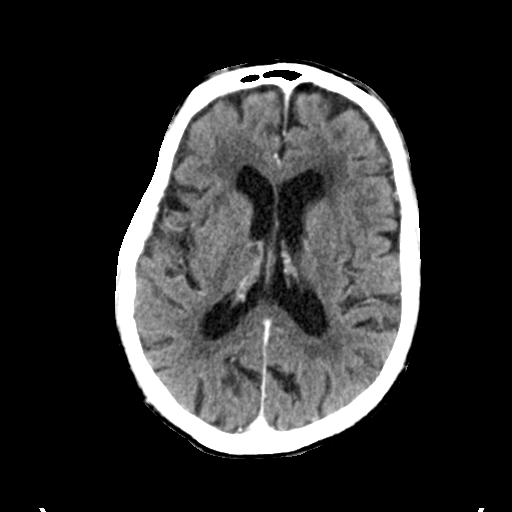
[im 17/33  bone]
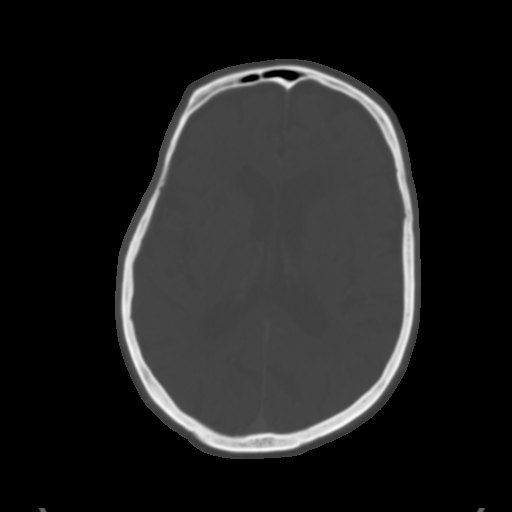
[im 20/33  brain]
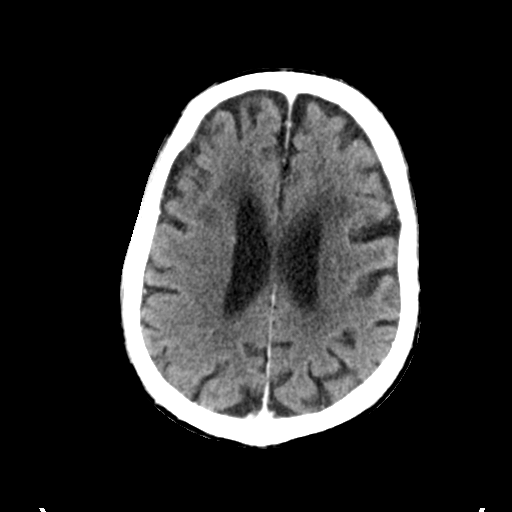
[im 24/33  brain]
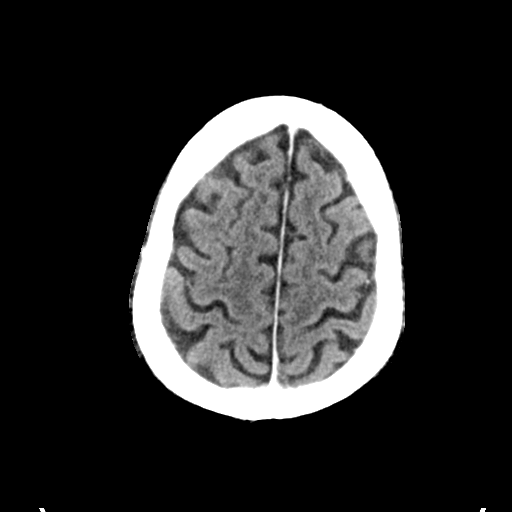
[im 27/33  brain]
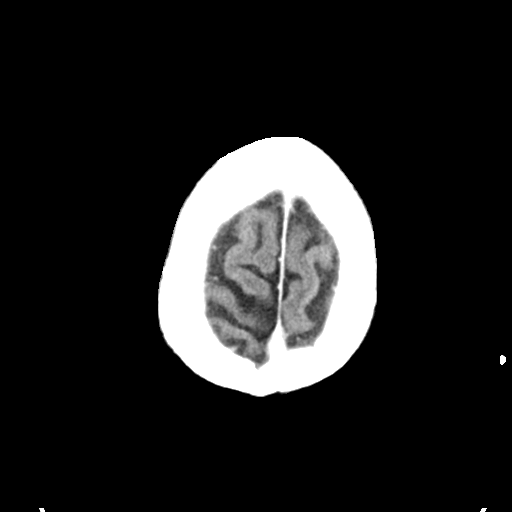
[im 30/33  brain]
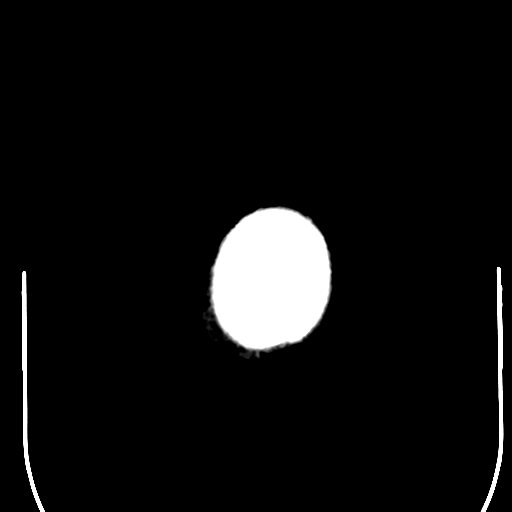
[im 30/33  bone]
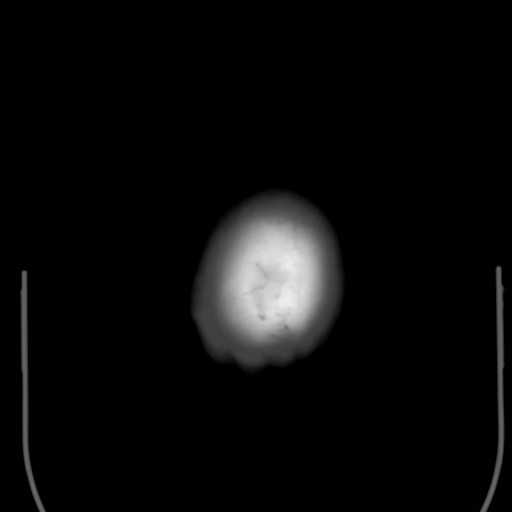

[Series 4: coronal soft tissue · coronal · 0.31mm/px · 3 of 72 slices shown]
[im 24/72  brain]
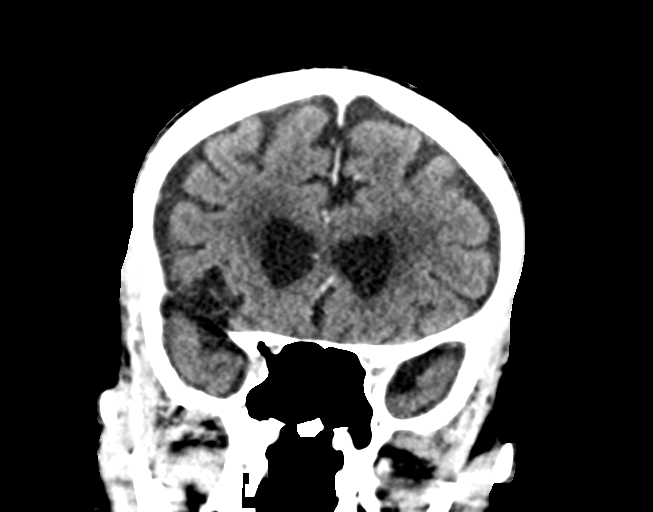
[im 32/72  brain]
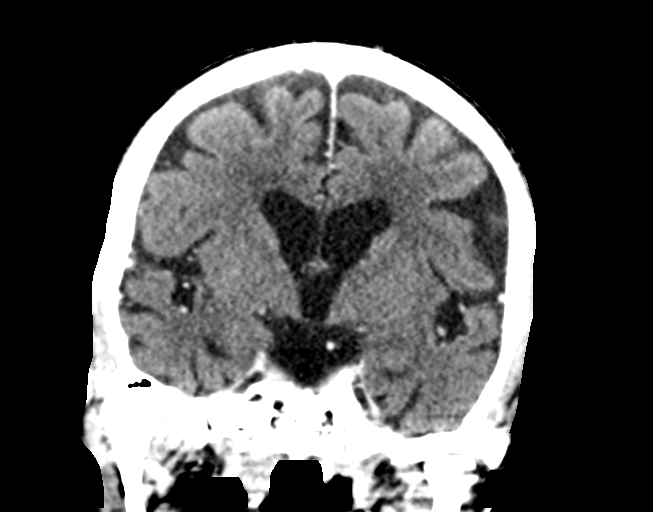
[im 40/72  brain]
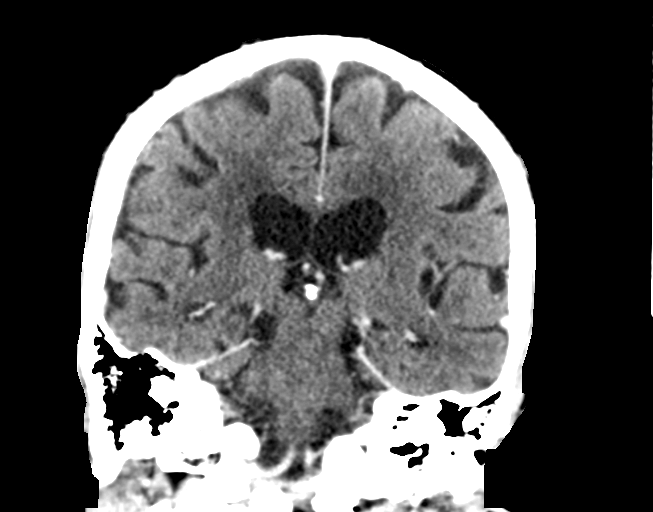

[Series 5: sagittal soft tissue · sagittal · 0.31mm/px · 3 of 55 slices shown]
[im 19/55  brain]
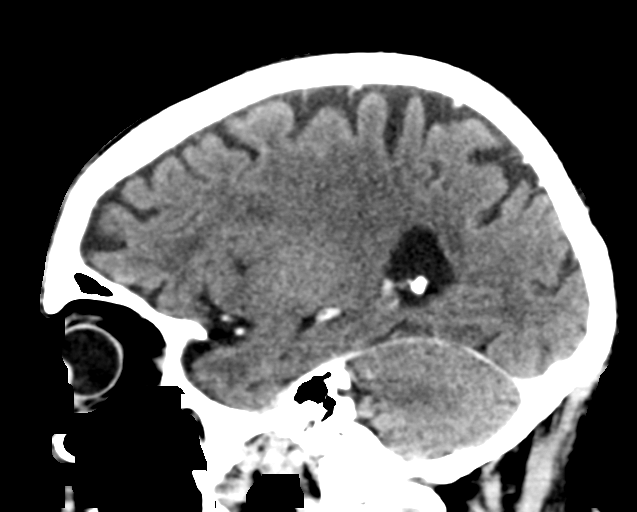
[im 28/55  brain]
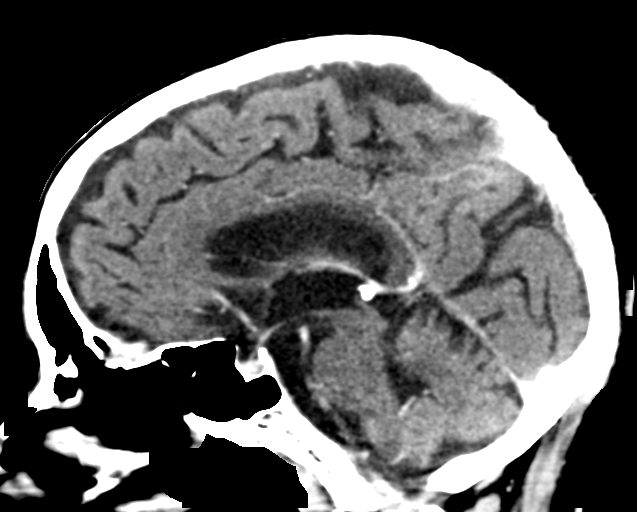
[im 37/55  brain]
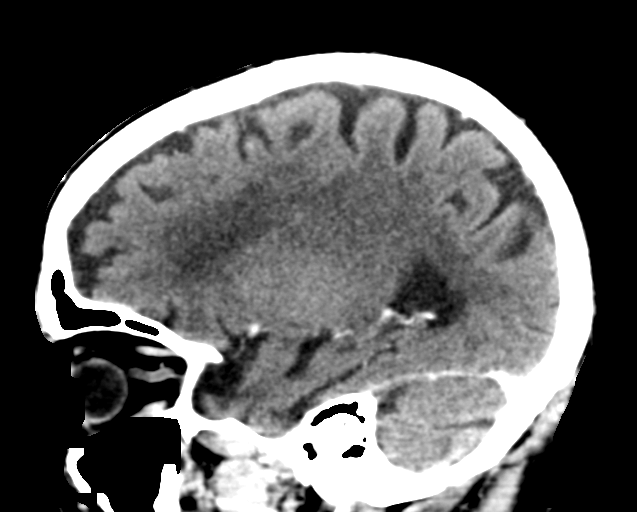

[15 of 47 positions shown; findings below may reference images not displayed]

FINDINGS: Brain: No evidence of acute infarction, hemorrhage, hydrocephalus,
extra-axial collection or mass lesion/mass effect. Stable chronic
microvascular ischemic changes and volume loss of the brain.

Vascular: Calcific atherosclerosis of carotid siphons. No hyperdense
vessel identified.

Skull: Normal. Negative for fracture or focal lesion.

Sinuses/Orbits: No acute finding.

Other: None.
IMPRESSION: 1. No evidence of metastatic disease to the brain or calvarium.
2. Stable chronic findings as above.

By: Rome Ledoux M.D.

## 2019-10-19 IMAGING — CR DG CHEST 2V
2 series · 2 of 2 positions shown · non-contrast
Comparison: CT chest dated 12/26/2008

CLINICAL DATA: Weakness

EXAM:
CHEST - 2 VIEW

[w chest lat]
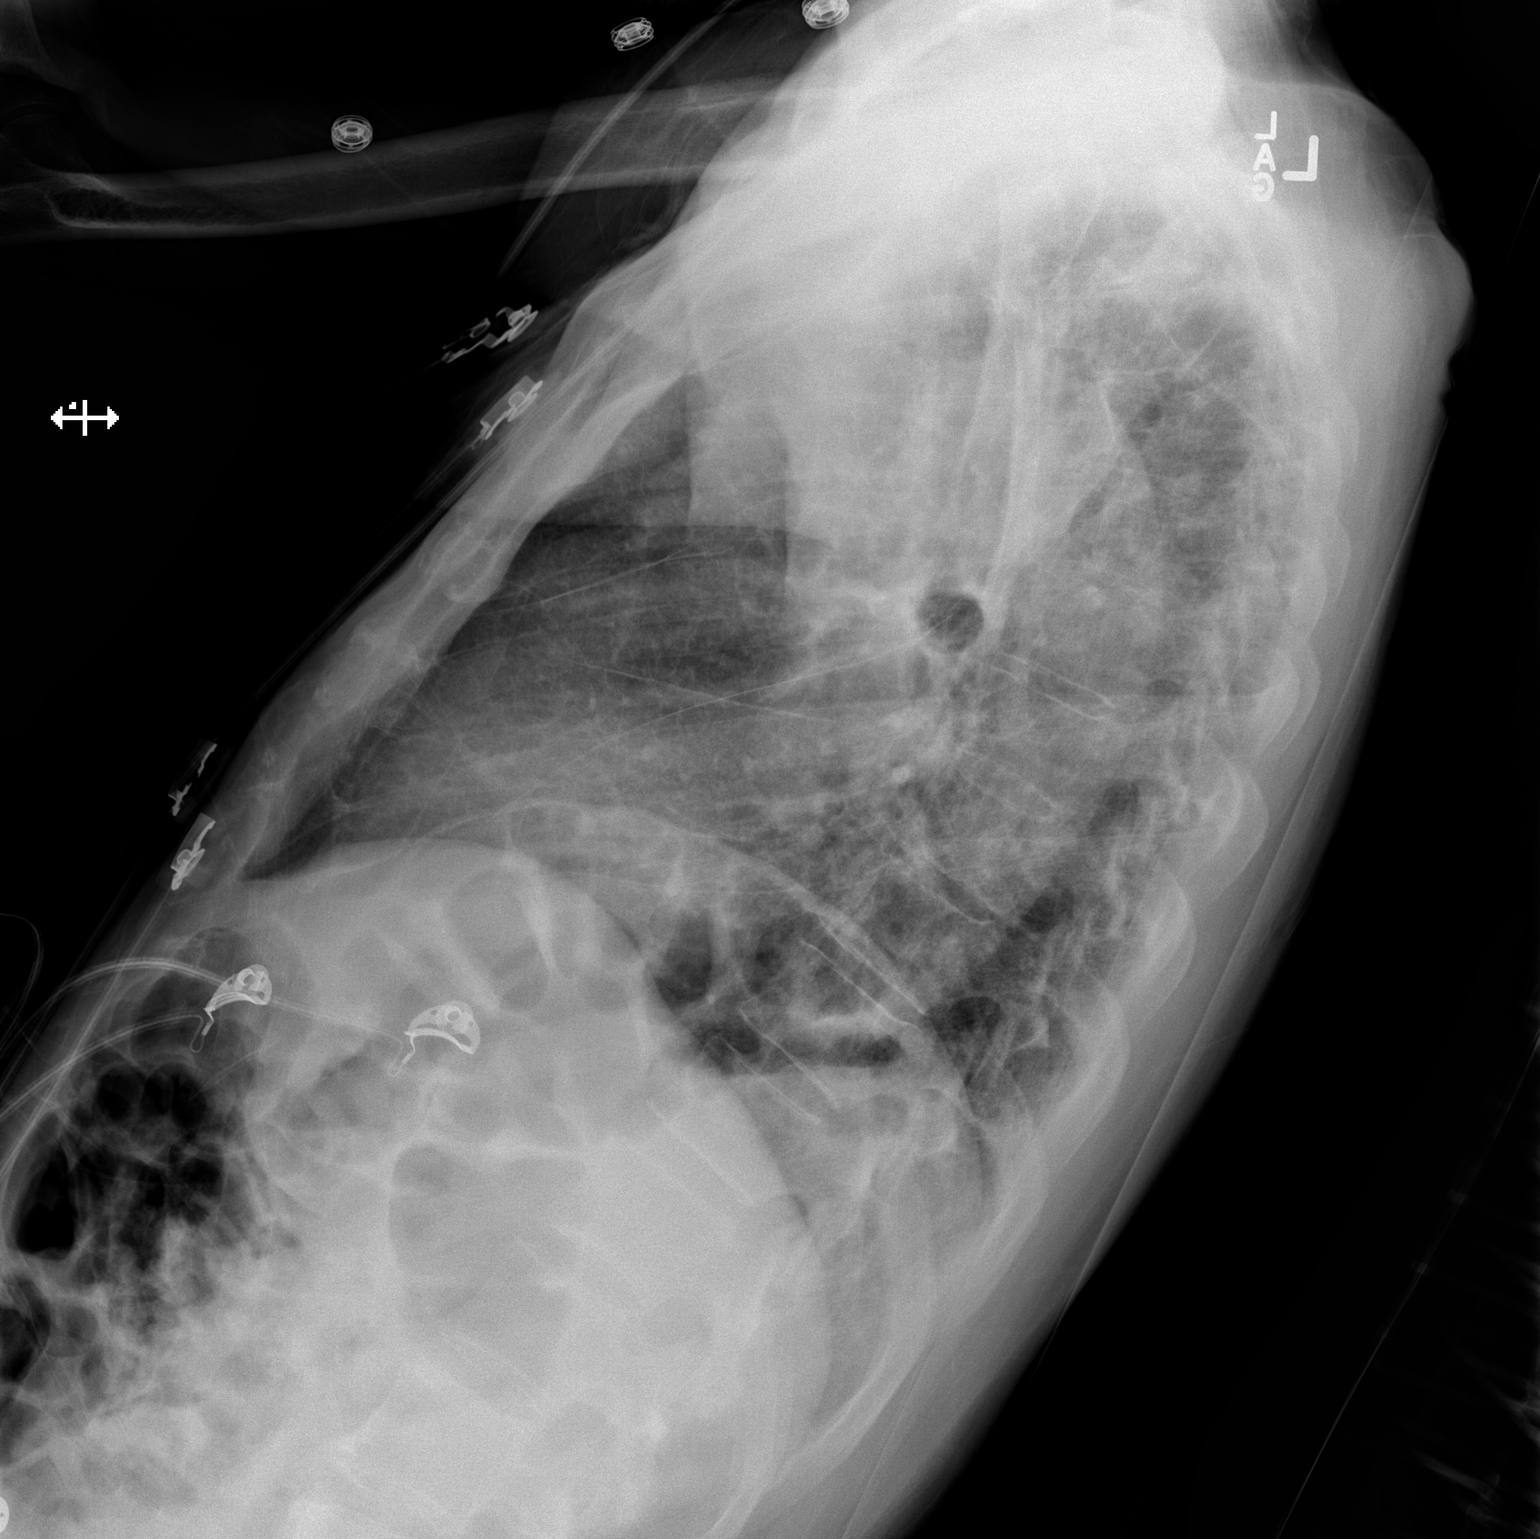

[x chest ap]
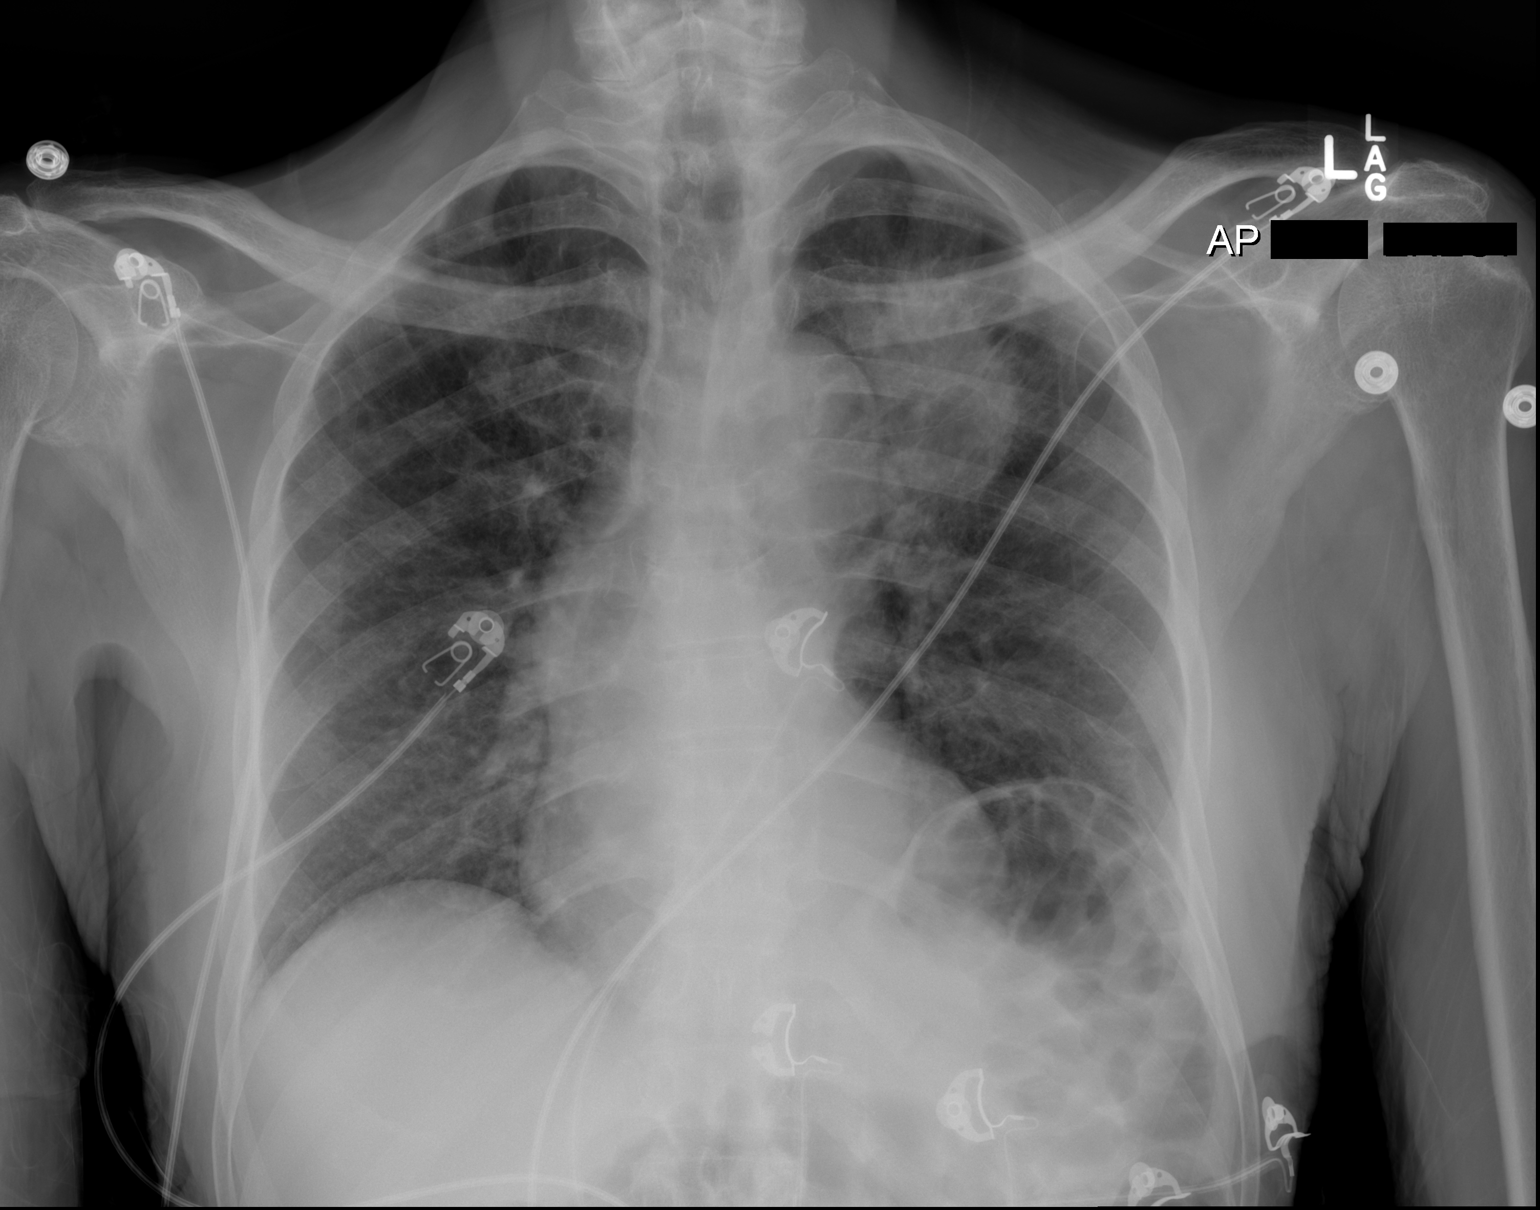

[2 of 2 positions shown; findings below may reference images not displayed]

FINDINGS: 6.2 cm mass in the left upper lobe. Right lung is clear. No pleural
effusion or pneumothorax.

The heart is normal in size.

Visualized osseous structures are within normal limits.
IMPRESSION: 6.2 cm mass in the left upper lobe, highly suspicious for primary
bronchogenic neoplasm. Notably, a 2.6 cm nodule was present in the
left upper lobe on prior CT, although presumably interval treatment
has occurred.

Consider CT chest with contrast for further evaluation.

## 2019-10-19 IMAGING — CT CT ABD-PELV W/ CM
2 of 5 series · 12 of 36 positions shown, 15 images · IV contrast (ISOVUE)
Comparison: CT chest dated 12/26/2008

CLINICAL DATA: Abnormal chest radiograph, for staging

EXAM:
CT CHEST, ABDOMEN, AND PELVIS WITH CONTRAST
TECHNIQUE: Multidetector CT imaging of the chest, abdomen and pelvis was
performed following the standard protocol during bolus
administration of intravenous contrast.
CONTRAST:  100mL [id number] IOPAMIDOL ([id number]) INJECTION 61%

[Series 3: cap with · axial · 0.71mm/px · z∈[-251,+264]mm · 9 of 129 slices shown, 12 images]
[im 13/129  mediastinal]
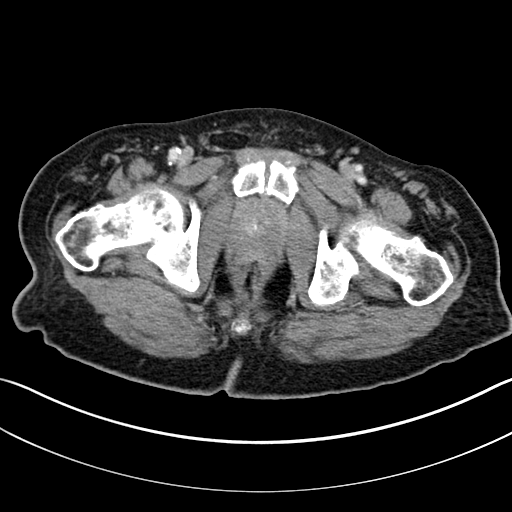
[im 13/129  lung]
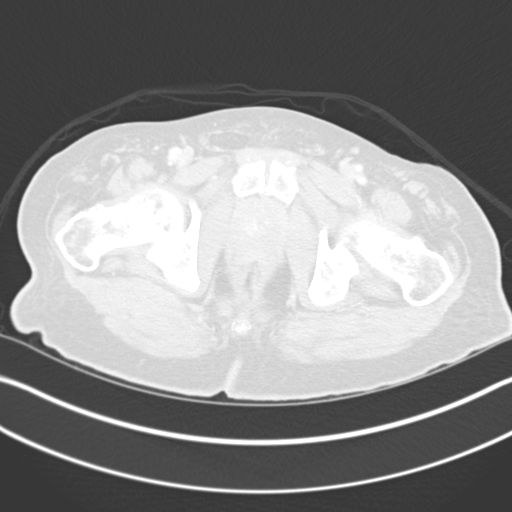
[im 26/129  lung]
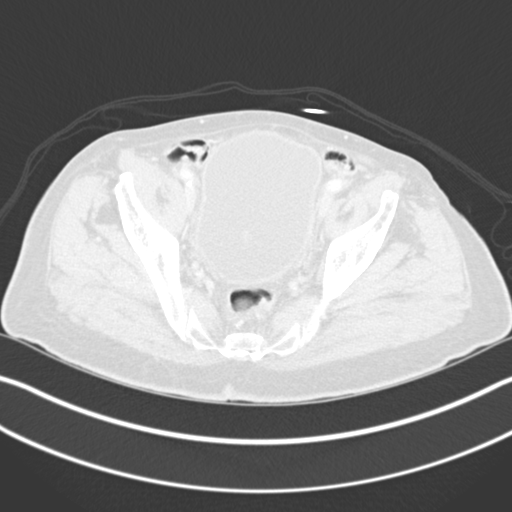
[im 39/129  lung]
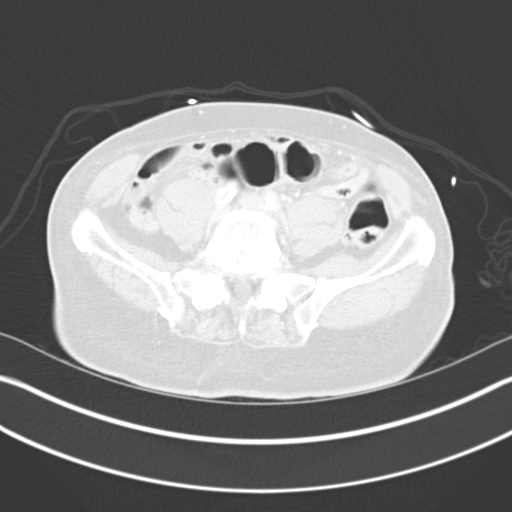
[im 52/129  lung]
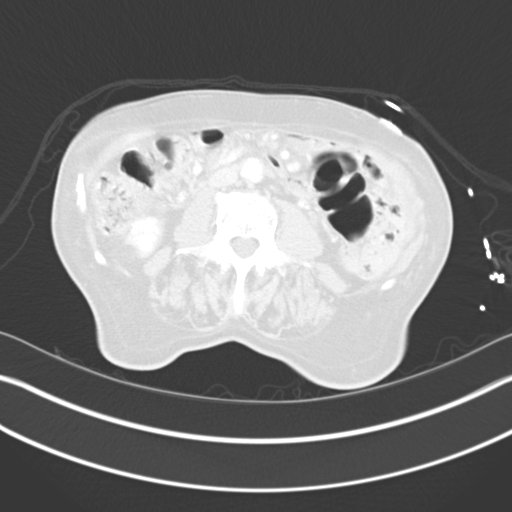
[im 65/129  mediastinal]
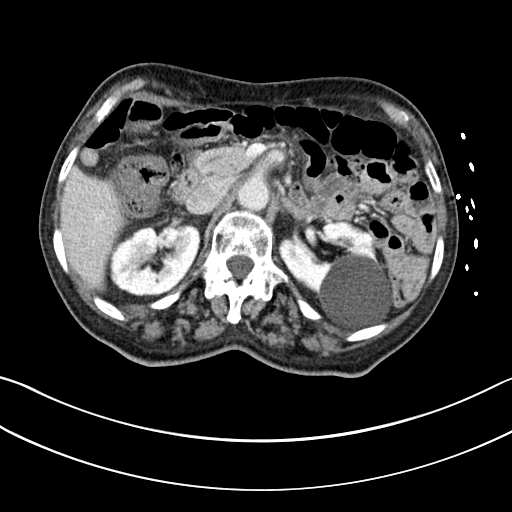
[im 65/129  lung]
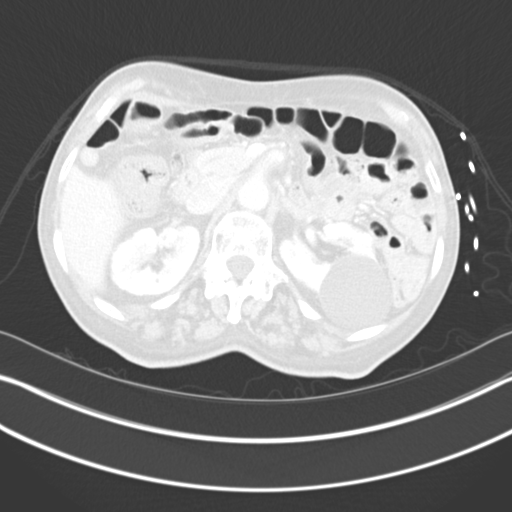
[im 77/129  lung]
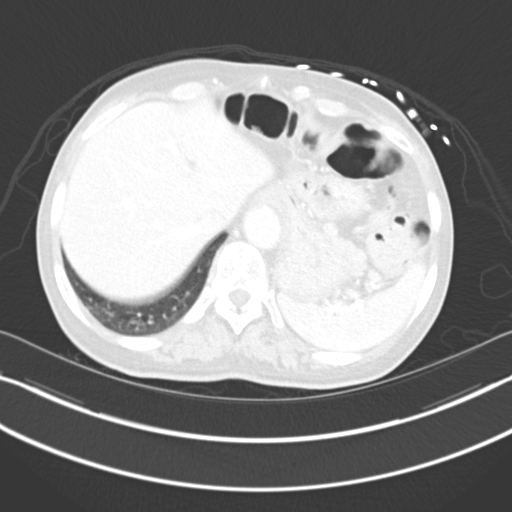
[im 90/129  lung]
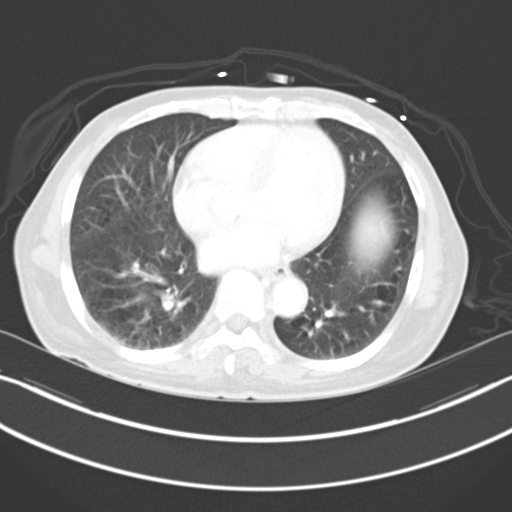
[im 103/129  lung]
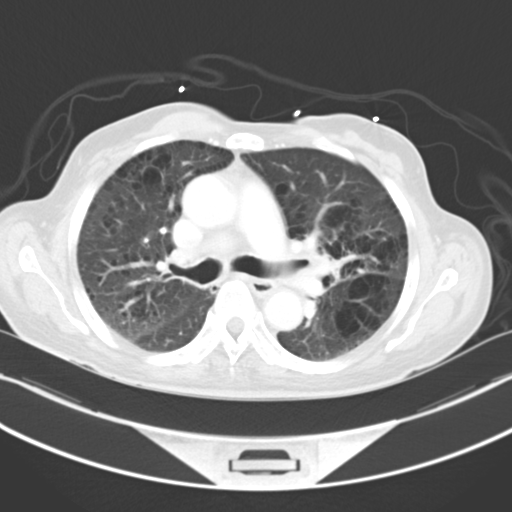
[im 116/129  mediastinal]
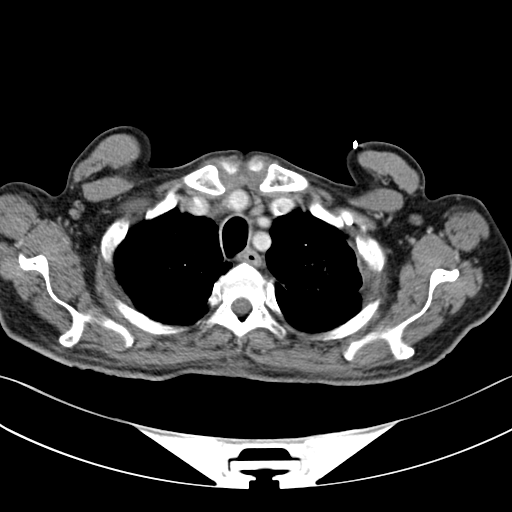
[im 116/129  lung]
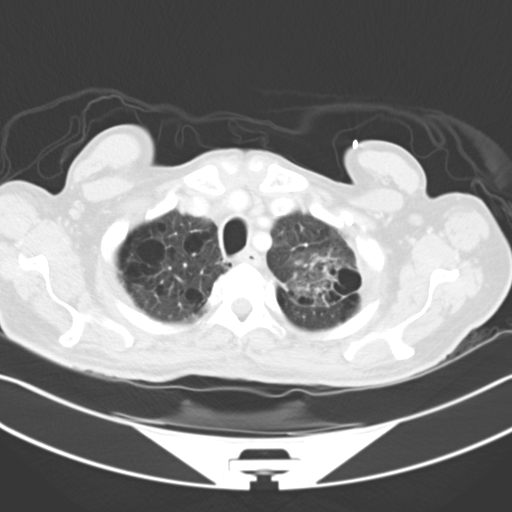

[Series 7: coronals · coronal · 0.79mm/px · 3 of 137 slices shown]
[im 28/137  lung]
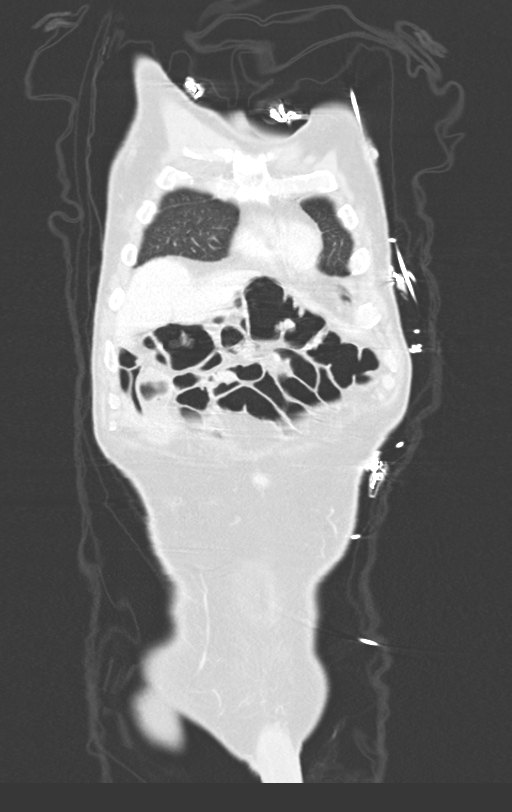
[im 55/137  lung]
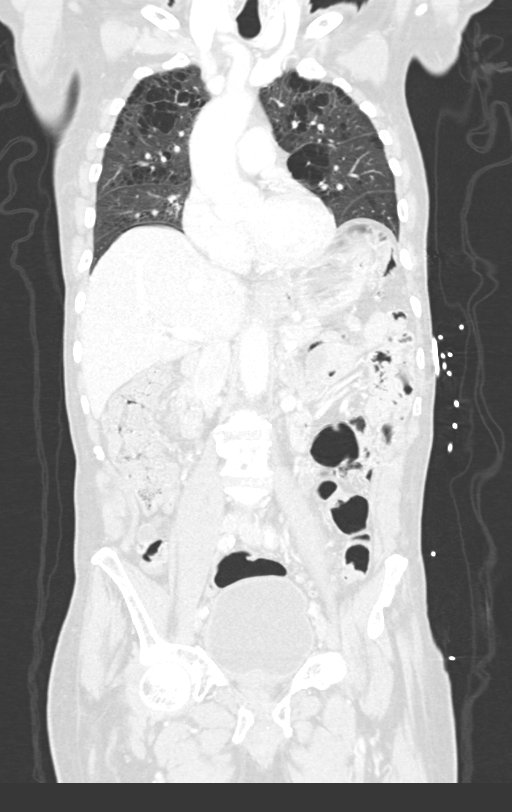
[im 82/137  lung]
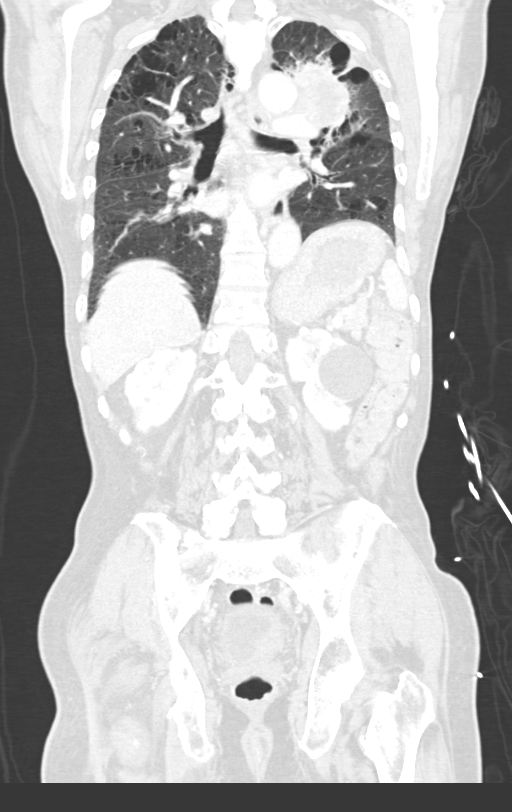

[12 of 36 positions shown; findings below may reference images not displayed]

FINDINGS: CT CHEST FINDINGS

Cardiovascular: The heart is normal in size. No pericardial
effusion.

No evidence of thoracic aortic aneurysm. Atherosclerotic
calcifications of the aortic arch.

Mild three-vessel coronary atherosclerosis.

Mediastinum/Nodes: Small mediastinal lymph nodes, including a 7 mm
short axis AP window node and an 8 mm short axis subcarinal node.

Visualized thyroid is grossly unremarkable.

Lungs/Pleura: 5.6 x 3.9 x 5.8 cm spiculated medial left upper lobe
mass (series 6/image 37), transgressing the left fissure, and
abutting the left suprahilar region. The mass invades the left upper
lobe pulmonary artery (coronal image 74).

Moderate centrilobular and paraseptal emphysematous changes.

No focal consolidation.

No pleural effusion or pneumothorax.

Musculoskeletal: Visualized osseous structures are within normal
limits.

CT ABDOMEN PELVIS FINDINGS

Hepatobiliary: Liver is notable for a 14 mm cyst in segment 4A
(series 3/image 52).

Gallbladder is unremarkable. No intrahepatic or extrahepatic ductal
dilatation.

Pancreas: Within normal limits.

Spleen: Within normal limits.

Adrenals/Urinary Tract: Thickening of the left adrenal gland (series
3/image 52), nonspecific and unchanged from 3525. Right adrenal
glands within normal limits.

Bilateral renal cysts, including a dominant 5.4 cm cyst in the
posterior left upper kidney (series 3/image 67). No hydronephrosis.

Bladder is mildly thick-walled.

Stomach/Bowel: Stomach is within normal limits.

No evidence of bowel obstruction.

Appendix is not discretely visualized.

No colonic wall thickening or mass is seen.

Vascular/Lymphatic: No evidence of abdominal aortic aneurysm.

Atherosclerotic calcifications of the abdominal aorta and branch
vessels.

No suspicious abdominopelvic lymphadenopathy.

Reproductive: Prostatomegaly, with enlargement of the central gland
which indents the base of the bladder (series 3/image 113),
suggesting BPH.

Other: No abdominopelvic ascites.

Tiny fat containing right inguinal hernia (series 3/image 120).

Musculoskeletal: Mild degenerative changes of the lumbar spine.
IMPRESSION: 5.8 cm spiculated medial left upper lobe mass, compatible with
primary bronchogenic neoplasm. Mass transgresses the left fissure
and abuts the left suprahilar region, invading the left upper lobe
pulmonary artery.

Small mediastinal lymph nodes, suspicious.

Mild thickening of the left adrenal gland, unchanged from 3525,
indeterminate but likely benign.

Additional ancillary findings as above.

## 2021-07-26 ENCOUNTER — Other Ambulatory Visit: Payer: Self-pay | Admitting: Family Medicine

## 2021-07-26 ENCOUNTER — Ambulatory Visit
Admission: RE | Admit: 2021-07-26 | Discharge: 2021-07-26 | Disposition: A | Discharge status: Home / Self Care | Payer: Medicare HMO | Source: Ambulatory Visit | Attending: Family Medicine | Admitting: Family Medicine

## 2021-07-26 DIAGNOSIS — C3412 Malignant neoplasm of upper lobe, left bronchus or lung: Secondary | ICD-10-CM

## 2021-07-30 ENCOUNTER — Other Ambulatory Visit: Payer: Self-pay | Admitting: Family Medicine

## 2021-07-30 DIAGNOSIS — J9811 Atelectasis: Secondary | ICD-10-CM

## 2021-08-23 ENCOUNTER — Ambulatory Visit
Admission: RE | Admit: 2021-08-23 | Discharge: 2021-08-23 | Disposition: A | Discharge status: Home / Self Care | Payer: Medicare HMO | Source: Ambulatory Visit | Attending: Family Medicine | Admitting: Family Medicine

## 2021-08-23 DIAGNOSIS — J9811 Atelectasis: Secondary | ICD-10-CM

## 2021-10-01 ENCOUNTER — Telehealth: Payer: Self-pay | Admitting: Physician Assistant

## 2021-10-01 NOTE — Telephone Encounter (Signed)
Scheduled appt per 7/10 referral. Pt's caregiver is aware of appt date and time. Pt's caregiver is aware to arrive 15 mins prior to appt time and to bring and updated insurance card. Pt's caregiver is aware of appt location.

## 2021-10-21 NOTE — Progress Notes (Unsigned)
[name] [name]   [phone]  CONSULT NOTE  REFERRING PHYSICIAN: Dr. Alfonse Spruce   REASON FOR CONSULTATION:  Thrombocytosis   HPI [name] [name] is a 88 y.o. male with a past medical history significant for lung cancer? ((***), rhabdo myelitis, schizophrenia, and congestive heart failure is referred to clinic for thrombocytosis   The patient recently had a follow-up visit with his primary care provider on 09/30/21.  At that appointment, the patient had several lab studies performed on 09/17/21 including a CBC which showed mild anemia with a Hbg of 11.8.  Normal MCV at 8.9. WBC a 5.5 and an elevated platelet count at 414K.    Of note, the patient did have iron deficiency anemia in November 2022 with a low hemoglobin 8.1, elevated platelet count at 598, normal white blood cell count.  His iron studies in November showed low total iron at 18, low saturation at 4%, and high TIBC of 427. Since this time, *** the patient has been on ***.   SED rate was drawn in the past and was slightly elevated at 25. ANA positive. And CRP is 28.9 in November 2022.      Per chart review, it appers that she had similar findings dating back to November 2022, although the patient was significantly anemic at that time. However, he was also anemic at that time which has since has since improved to only mild anemia. The patient is here today for further evaluation regarding his condition.   Otherwise I have labs dating back to 2009, 2013, 2019.  No evidence of thrombocytosis at this time.  Why anemic in November?    Today the patient is feeling fairly well without any concerning complaints. He denies any fever, chills, night sweats, or lymphadenopathy. He denies any nausea, vomiting, diarrhea, constipation, abdominal pain, or abdominal fullness. He denies any bleeding or bruising including melena, hematuria, hematochezia, epistaxis, or gingival bleeding. She is presently ***  taking 81 mg aspirin. He has *** colonoscopy. He denies any recent infections. He denies any rashes or skin changes except for ***.  He denies any unusual headaches, lightheadedness, syncope, or visual changes.  He denies any numbness and tingling in her hands and feet.  The patient denies any particular dietary habits such as being vegetarian or vegan. he denies history of DVT, MI, PE, or CVA. he is not on any steroids.    The patient's denies any family history for known hematologic or oncologic disorders.  Denies known family history for blood clots.*** HPI  Past Medical History:  Diagnosis Date   HLD (hyperlipidemia)    Hypertensive heart disease with heart failure ([place]) 01/01/2018   Nodule of upper lobe of left lung 2009   PET avid concerning for cancer, decision was made at this time for no further investigation as he would not want treatment     No past surgical history on file.  Family History  Problem Relation Age of Onset   Cancer Brother    Cancer Daughter     Social History Social History   Tobacco Use   Smoking status: Every Day    Packs/day: 0.50    Types: Cigarettes   Smokeless tobacco: Never  Substance Use Topics   Alcohol use: Yes    Comment: beer   Drug use: No    No Known Allergies  Current Outpatient Medications  Medication Sig Dispense Refill   aspirin 81 MG chewable tablet Chew 1 tablet (81 mg  total) by mouth daily. 30 tablet 0   ENSURE (ENSURE) Take 120 mLs by mouth 2 (two) times daily between meals.     metoprolol succinate (TOPROL-XL) 25 MG 24 hr tablet Take 1 tablet (25 mg total) by mouth daily. 30 tablet 0   nicotine (NICODERM CQ - DOSED IN MG/24 HOURS) 21 mg/24hr patch Place 1 patch (21 mg total) onto the skin daily. 28 patch 0   NON FORMULARY Diet Type:  Regular diet, regular texture     Nutritional Supplements (PROMOD) LIQD Take 30 mLs by mouth 2 (two) times daily.     No current facility-administered medications for this visit.    REVIEW  OF SYSTEMS:   Review of Systems  Constitutional: Negative for appetite change, chills, fatigue, fever and unexpected weight change.  HENT:   Negative for mouth sores, nosebleeds, sore throat and trouble swallowing.   Eyes: Negative for eye problems and icterus.  Respiratory: Negative for cough, hemoptysis, shortness of breath and wheezing.   Cardiovascular: Negative for chest pain and leg swelling.  Gastrointestinal: Negative for abdominal pain, constipation, diarrhea, nausea and vomiting.  Genitourinary: Negative for bladder incontinence, difficulty urinating, dysuria, frequency and hematuria.   Musculoskeletal: Negative for back pain, gait problem, neck pain and neck stiffness.  Skin: Negative for itching and rash.  Neurological: Negative for dizziness, extremity weakness, gait problem, headaches, light-headedness and seizures.  Hematological: Negative for adenopathy. Does not bruise/bleed easily.  Psychiatric/Behavioral: Negative for confusion, depression and sleep disturbance. The patient is not nervous/anxious.     PHYSICAL EXAMINATION:  There were no vitals taken for this visit.  ECOG PERFORMANCE STATUS: {CHL ONC ECOG [id number]  Physical Exam  Constitutional: Oriented to person, place, and time and well-developed, well-nourished, and in no distress. No distress.  HENT:  Head: Normocephalic and atraumatic.  Mouth/Throat: Oropharynx is clear and moist. No oropharyngeal exudate.  Eyes: Conjunctivae are normal. Right eye exhibits no discharge. Left eye exhibits no discharge. No scleral icterus.  Neck: Normal range of motion. Neck supple.  Cardiovascular: Normal rate, regular rhythm, normal heart sounds and intact distal pulses.   Pulmonary/Chest: Effort normal and breath sounds normal. No respiratory distress. No wheezes. No rales.  Abdominal: Soft. Bowel sounds are normal. Exhibits no distension and no mass. There is no tenderness.  Musculoskeletal: Normal range of motion.  Exhibits no edema.  Lymphadenopathy:    No cervical adenopathy.  Neurological: Alert and oriented to person, place, and time. Exhibits normal muscle tone. Gait normal. Coordination normal.  Skin: Skin is warm and dry. No rash noted. Not diaphoretic. No erythema. No pallor.  Psychiatric: Mood, memory and judgment normal.  Vitals reviewed.  LABORATORY DATA: Lab Results  Component Value Date   WBC 7.4 12/23/2017   HGB 13.4 12/23/2017   HCT 40.9 12/23/2017   MCV 83.5 12/23/2017   PLT 372 12/23/2017      Chemistry      Component Value Date/Time   NA 141 01/04/2018 0000   K 5.1 01/04/2018 0000   CL 102 12/23/2017 0245   CO2 27 12/23/2017 0245   BUN 26 (A) 01/04/2018 0000   CREATININE 0.6 01/04/2018 0000   CREATININE 0.84 12/23/2017 0245   GLU 101 01/04/2018 0000      Component Value Date/Time   CALCIUM 9.4 12/23/2017 0245   ALKPHOS 119 01/04/2018 0000   AST 30 01/04/2018 0000   ALT 41 (A) 01/04/2018 0000   BILITOT 0.7 12/23/2017 0245       RADIOGRAPHIC STUDIES: No results  found.  ASSESSMENT: This is a very pleasant 88 year old *** male referred to the clinic for thrombocytosis.  The patient was seen with Dr. Julien Nordmann today.  The patient's PCP recently performed iron studies and ***.  The patient was seen with Dr. Julien Nordmann.  The patient had several lab studies performed at our clinic including CBC, CMP, LDH, and JAK2 mutation testing to assess for myeloproliferative disorder. Iron and ferritin ***  The patient's CBC from today shows normal CBC except for continued elevated platelet count at ***k. Her CMP and LDH are unremarkable.  We anticipate the JAK2 mutation testing to take approximately 10 to 14 days to have the results.  If this shows evidence of primary myeloproliferative disorder, we will call the patient back to clinic to discuss management and treatment of this.  We will see the patient back for follow-up visit in 3 months for repeat cbc, iron studies, ferritin,  cmp, and LDH for monitoring of her platelet count. If his Jak2 mutation testing is abnormal, I will call him to be seen sooner. I will call him either way with the results.    Discussed she can take 81 mg aspirin which he used to be on, unless it was discontinued for some reason. ****   She was advised to continue her iron supplement. ***   The patient voices understanding of current disease status and treatment options and is in agreement with the current care plan.    The patient voices understanding of current disease status and treatment options and is in agreement with the current care plan.  All questions were answered. The patient knows to call the clinic with any problems, questions or concerns. We can certainly see the patient much sooner if necessary.  Thank you so much for allowing me to participate in the care of CAYDE HELD. I will continue to follow up the patient with you and assist in his care.  I spent {CHL ONC TIME VISIT - WCBJS:[id number]} counseling the patient face to face. The total time spent in the appointment was {CHL ONC TIME VISIT - WVPXT:[id number]}.  Disclaimer: This note was dictated with voice recognition software. Similar sounding words can inadvertently be transcribed and may not be corrected upon review.   Shatori Bertucci L Nanetta Wiegman October 21, 2021, 10:01 AM

## 2021-10-22 ENCOUNTER — Other Ambulatory Visit: Payer: Self-pay | Admitting: Physician Assistant

## 2021-10-22 DIAGNOSIS — C3492 Malignant neoplasm of unspecified part of left bronchus or lung: Secondary | ICD-10-CM

## 2021-10-22 DIAGNOSIS — D75839 Thrombocytosis, unspecified: Secondary | ICD-10-CM

## 2021-10-23 ENCOUNTER — Other Ambulatory Visit: Payer: Self-pay

## 2021-10-23 ENCOUNTER — Encounter: Payer: Self-pay | Admitting: Physician Assistant

## 2021-10-23 ENCOUNTER — Inpatient Hospital Stay: Payer: Medicare Other

## 2021-10-23 ENCOUNTER — Inpatient Hospital Stay: Payer: Medicare Other | Attending: Physician Assistant | Admitting: Physician Assistant

## 2021-10-23 VITALS — BP 125/66 | HR 79 | Temp 98.0°F | Resp 24 | Ht 72.0 in | Wt 115.7 lb

## 2021-10-23 DIAGNOSIS — E611 Iron deficiency: Secondary | ICD-10-CM | POA: Insufficient documentation

## 2021-10-23 DIAGNOSIS — Z5329 Procedure and treatment not carried out because of patient's decision for other reasons: Secondary | ICD-10-CM | POA: Diagnosis not present

## 2021-10-23 DIAGNOSIS — C3492 Malignant neoplasm of unspecified part of left bronchus or lung: Secondary | ICD-10-CM | POA: Diagnosis not present

## 2021-10-23 DIAGNOSIS — Z809 Family history of malignant neoplasm, unspecified: Secondary | ICD-10-CM | POA: Diagnosis not present

## 2021-10-23 DIAGNOSIS — Z85118 Personal history of other malignant neoplasm of bronchus and lung: Secondary | ICD-10-CM | POA: Insufficient documentation

## 2021-10-23 DIAGNOSIS — D75839 Thrombocytosis, unspecified: Secondary | ICD-10-CM

## 2021-10-23 DIAGNOSIS — R918 Other nonspecific abnormal finding of lung field: Secondary | ICD-10-CM | POA: Insufficient documentation

## 2021-10-23 DIAGNOSIS — I509 Heart failure, unspecified: Secondary | ICD-10-CM | POA: Diagnosis not present

## 2021-10-23 DIAGNOSIS — Z7982 Long term (current) use of aspirin: Secondary | ICD-10-CM | POA: Insufficient documentation

## 2021-10-23 DIAGNOSIS — R0609 Other forms of dyspnea: Secondary | ICD-10-CM | POA: Insufficient documentation

## 2021-10-23 DIAGNOSIS — D75838 Other thrombocytosis: Secondary | ICD-10-CM | POA: Insufficient documentation

## 2021-10-23 DIAGNOSIS — R634 Abnormal weight loss: Secondary | ICD-10-CM | POA: Insufficient documentation

## 2021-10-23 DIAGNOSIS — F17210 Nicotine dependence, cigarettes, uncomplicated: Secondary | ICD-10-CM | POA: Insufficient documentation

## 2021-10-23 LAB — CMP (CANCER CENTER ONLY)
ALT: 11 U/L (ref 0–44)
AST: 15 U/L (ref 15–41)
Albumin: 3.8 g/dL (ref 3.5–5.0)
Alkaline Phosphatase: 91 U/L (ref 38–126)
Anion gap: 8 (ref 5–15)
BUN: 18 mg/dL (ref 8–23)
CO2: 28 mmol/L (ref 22–32)
Calcium: 8.8 mg/dL — ABNORMAL LOW (ref 8.9–10.3)
Chloride: 102 mmol/L (ref 98–111)
Creatinine: 0.96 mg/dL (ref 0.61–1.24)
GFR, Estimated: >60 mL/min (ref >60)
Glucose, Bld: 112 mg/dL — ABNORMAL HIGH (ref 70–99)
Potassium: 4 mmol/L (ref 3.5–5.1)
Sodium: 138 mmol/L (ref 135–145)
Total Bilirubin: 0.7 mg/dL (ref 0.3–1.2)
Total Protein: 7.3 g/dL (ref 6.5–8.1)

## 2021-10-23 LAB — CBC WITH DIFFERENTIAL (CANCER CENTER ONLY)
Abs Immature Granulocytes: 0.01 10*3/uL (ref 0.00–0.07)
Basophils Absolute: 0 10*3/uL (ref 0.0–0.1)
Basophils Relative: 0 %
Eosinophils Absolute: 0.1 10*3/uL (ref 0.0–0.5)
Eosinophils Relative: 1 %
HCT: 37.9 % — ABNORMAL LOW (ref 39.0–52.0)
Hemoglobin: 11.9 g/dL — ABNORMAL LOW (ref 13.0–17.0)
Immature Granulocytes: 0 %
Lymphocytes Relative: 15 %
Lymphs Abs: 0.9 10*3/uL (ref 0.7–4.0)
MCH: 27.3 pg (ref 26.0–34.0)
MCHC: 31.4 g/dL (ref 30.0–36.0)
MCV: 86.9 fL (ref 80.0–100.0)
Monocytes Absolute: 0.6 10*3/uL (ref 0.1–1.0)
Monocytes Relative: 9 %
Neutro Abs: 4.6 10*3/uL (ref 1.7–7.7)
Neutrophils Relative %: 75 %
Platelet Count: 410 10*3/uL — ABNORMAL HIGH (ref 150–400)
RBC: 4.36 MIL/uL (ref 4.22–5.81)
RDW: 14.8 % (ref 11.5–15.5)
WBC Count: 6.1 10*3/uL (ref 4.0–10.5)
nRBC: 0 % (ref 0.0–0.2)

## 2021-10-23 LAB — IRON AND IRON BINDING CAPACITY (CC-WL,HP ONLY)
Iron: 27 ug/dL — ABNORMAL LOW (ref 45–182)
Saturation Ratios: 10 % — ABNORMAL LOW (ref 17.9–39.5)
TIBC: 281 ug/dL (ref 250–450)
UIBC: 254 ug/dL

## 2021-10-23 LAB — FERRITIN: Ferritin: 59 ng/mL (ref 24–336)

## 2021-10-28 ENCOUNTER — Encounter: Payer: Self-pay | Admitting: Physician Assistant

## 2021-11-20 ENCOUNTER — Ambulatory Visit: Payer: Medicare Other | Attending: Cardiology | Admitting: Cardiology

## 2021-12-02 ENCOUNTER — Ambulatory Visit: Payer: Medicare Other | Attending: Cardiology | Admitting: Internal Medicine

## 2021-12-02 NOTE — Progress Notes (Deleted)
Cardiology Office Note:    Date:  12/02/2021   ID:  [name] [name], DOB 03/27/1933, MRN [id number]  PCP:  Roselee Nova, MD   [facility] Cardiologist:  Fransico Him, MD { Click to update primary MD,subspecialty MD or APP then REFRESH:1}    Referring MD: Roselee Nova, MD   No chief complaint on file. ***  History of Present Illness:    [name] [name] is a 88 y.o. male with a hx of HLD,  EF 15-25% in 2019, seen by Dr. Einar Gip,   He was seen by Dr. Einar Gip in 2019 and note to have severe LV dysfunction. He had overall poor life expectancy with lung CA. Palliative care was recommended. He was made DNR.  Past Medical History:  Diagnosis Date   HLD (hyperlipidemia)    Hypertensive heart disease with heart failure ([place]) 01/01/2018   Nodule of upper lobe of left lung 2009   PET avid concerning for cancer, decision was made at this time for no further investigation as he would not want treatment     No past surgical history on file.  Current Medications: No outpatient medications have been marked as taking for the 12/02/21 encounter (Appointment) with Janina Mayo, MD.     Allergies:   Patient has no known allergies.   Social History   Socioeconomic History   Marital status: Single    Spouse name: Not on file   Number of children: Not on file   Years of education: Not on file   Highest education level: Not on file  Occupational History   Not on file  Tobacco Use   Smoking status: Every Day    Packs/day: 0.50    Types: Cigarettes   Smokeless tobacco: Never  Substance and Sexual Activity   Alcohol use: Yes    Comment: beer   Drug use: No   Sexual activity: Not on file  Other Topics Concern   Not on file  Social History Narrative   ** Merged History Encounter **       Social Determinants of Health   Financial Resource Strain: Not on file  Food Insecurity: Not on file  Transportation Needs: Not on file  Physical Activity: Not on  file  Stress: Not on file  Social Connections: Not on file     Family History: The patient's ***family history includes Cancer in his brother and daughter.  ROS:   Please see the history of present illness.    *** All other systems reviewed and are negative.  EKGs/Labs/Other Studies Reviewed:    The following studies were reviewed today: ***  EKG:  EKG is *** ordered today.  The ekg ordered today demonstrates ***  Recent Labs: 10/23/2021: ALT 11; BUN 18; Creatinine 0.96; Hemoglobin 11.9; Platelet Count 410; Potassium 4.0; Sodium 138  Recent Lipid Panel    Component Value Date/Time   CHOL 103 12/23/2017 0616   TRIG 27 12/23/2017 0616   HDL 30 (L) 12/23/2017 0616   CHOLHDL 3.4 12/23/2017 0616   VLDL 5 12/23/2017 0616   LDLCALC 68 12/23/2017 0616     Risk Assessment/Calculations:   {Does this patient have ATRIAL FIBRILLATION?:[phone]}  No BP recorded.  {Refresh Note OR Click here to enter BP  :1}***         Physical Exam:    VS:  There were no vitals taken for this visit.    Wt Readings from Last 3 Encounters:  10/23/21 115  lb 11.2 oz (52.5 kg)  01/20/18 145 lb 3.2 oz (65.9 kg)  01/08/18 138 lb 6.4 oz (62.8 kg)     GEN: *** Well nourished, well developed in no acute distress HEENT: Normal NECK: No JVD; No carotid bruits LYMPHATICS: No lymphadenopathy CARDIAC: ***RRR, no murmurs, rubs, gallops RESPIRATORY:  Clear to auscultation without rales, wheezing or rhonchi  ABDOMEN: Soft, non-tender, non-distended MUSCULOSKELETAL:  No edema; No deformity  SKIN: Warm and dry NEUROLOGIC:  Alert and oriented x 3 PSYCHIATRIC:  Normal affect   ASSESSMENT:    No diagnosis found. PLAN:    In order of problems listed above:  ***      {Are you ordering a CV Procedure (e.g. stress test, cath, DCCV, TEE, etc)?   Press F2        :[id number]}    Medication Adjustments/Labs and Tests Ordered: Current medicines are reviewed at length with the patient today.  Concerns  regarding medicines are outlined above.  No orders of the defined types were placed in this encounter.  No orders of the defined types were placed in this encounter.   There are no Patient Instructions on file for this visit.   Signed, Janina Mayo, MD  12/02/2021 7:21 AM    [place]

## 2022-01-30 ENCOUNTER — Emergency Department (HOSPITAL_COMMUNITY): Payer: Medicare Other

## 2022-01-30 ENCOUNTER — Other Ambulatory Visit: Payer: Self-pay

## 2022-01-30 ENCOUNTER — Inpatient Hospital Stay (HOSPITAL_COMMUNITY)
Admission: EM | Admit: 2022-01-30 | Discharge: 2022-02-21 | DRG: 951 | Disposition: E | Payer: Medicare Other | Attending: Internal Medicine | Admitting: Internal Medicine

## 2022-01-30 DIAGNOSIS — R0600 Dyspnea, unspecified: Secondary | ICD-10-CM

## 2022-01-30 DIAGNOSIS — Z809 Family history of malignant neoplasm, unspecified: Secondary | ICD-10-CM | POA: Diagnosis not present

## 2022-01-30 DIAGNOSIS — E860 Dehydration: Secondary | ICD-10-CM | POA: Diagnosis not present

## 2022-01-30 DIAGNOSIS — F419 Anxiety disorder, unspecified: Secondary | ICD-10-CM | POA: Diagnosis present

## 2022-01-30 DIAGNOSIS — Z85118 Personal history of other malignant neoplasm of bronchus and lung: Secondary | ICD-10-CM

## 2022-01-30 DIAGNOSIS — F209 Schizophrenia, unspecified: Secondary | ICD-10-CM | POA: Diagnosis present

## 2022-01-30 DIAGNOSIS — N179 Acute kidney failure, unspecified: Secondary | ICD-10-CM | POA: Diagnosis present

## 2022-01-30 DIAGNOSIS — Z7982 Long term (current) use of aspirin: Secondary | ICD-10-CM | POA: Diagnosis not present

## 2022-01-30 DIAGNOSIS — Z515 Encounter for palliative care: Principal | ICD-10-CM

## 2022-01-30 DIAGNOSIS — C3490 Malignant neoplasm of unspecified part of unspecified bronchus or lung: Secondary | ICD-10-CM | POA: Diagnosis not present

## 2022-01-30 DIAGNOSIS — M6282 Rhabdomyolysis: Secondary | ICD-10-CM | POA: Diagnosis present

## 2022-01-30 DIAGNOSIS — J9819 Other pulmonary collapse: Secondary | ICD-10-CM | POA: Diagnosis present

## 2022-01-30 DIAGNOSIS — Z87891 Personal history of nicotine dependence: Secondary | ICD-10-CM | POA: Diagnosis not present

## 2022-01-30 DIAGNOSIS — E875 Hyperkalemia: Secondary | ICD-10-CM | POA: Diagnosis present

## 2022-01-30 DIAGNOSIS — R64 Cachexia: Secondary | ICD-10-CM | POA: Diagnosis present

## 2022-01-30 DIAGNOSIS — Z79899 Other long term (current) drug therapy: Secondary | ICD-10-CM

## 2022-01-30 DIAGNOSIS — R451 Restlessness and agitation: Secondary | ICD-10-CM | POA: Diagnosis not present

## 2022-01-30 DIAGNOSIS — Z7189 Other specified counseling: Secondary | ICD-10-CM | POA: Diagnosis not present

## 2022-01-30 DIAGNOSIS — Z602 Problems related to living alone: Secondary | ICD-10-CM | POA: Diagnosis present

## 2022-01-30 DIAGNOSIS — R52 Pain, unspecified: Secondary | ICD-10-CM | POA: Diagnosis not present

## 2022-01-30 DIAGNOSIS — G9349 Other encephalopathy: Secondary | ICD-10-CM | POA: Diagnosis present

## 2022-01-30 DIAGNOSIS — Z66 Do not resuscitate: Secondary | ICD-10-CM

## 2022-01-30 DIAGNOSIS — R54 Age-related physical debility: Secondary | ICD-10-CM | POA: Diagnosis present

## 2022-01-30 DIAGNOSIS — I110 Hypertensive heart disease with heart failure: Secondary | ICD-10-CM | POA: Diagnosis present

## 2022-01-30 DIAGNOSIS — R627 Adult failure to thrive: Secondary | ICD-10-CM | POA: Diagnosis not present

## 2022-01-30 DIAGNOSIS — Z681 Body mass index (BMI) 19 or less, adult: Secondary | ICD-10-CM

## 2022-01-30 DIAGNOSIS — E785 Hyperlipidemia, unspecified: Secondary | ICD-10-CM | POA: Diagnosis present

## 2022-01-30 DIAGNOSIS — C7931 Secondary malignant neoplasm of brain: Secondary | ICD-10-CM

## 2022-01-30 DIAGNOSIS — I5022 Chronic systolic (congestive) heart failure: Secondary | ICD-10-CM | POA: Diagnosis present

## 2022-01-30 LAB — CBC WITH DIFFERENTIAL/PLATELET
Abs Immature Granulocytes: 0.08 10*3/uL — ABNORMAL HIGH (ref 0.00–0.07)
Basophils Absolute: 0 10*3/uL (ref 0.0–0.1)
Basophils Relative: 0 %
Eosinophils Absolute: 0 10*3/uL (ref 0.0–0.5)
Eosinophils Relative: 0 %
HCT: 33.4 % — ABNORMAL LOW (ref 39.0–52.0)
Hemoglobin: 9.8 g/dL — ABNORMAL LOW (ref 13.0–17.0)
Immature Granulocytes: 1 %
Lymphocytes Relative: 5 %
Lymphs Abs: 0.6 10*3/uL — ABNORMAL LOW (ref 0.7–4.0)
MCH: 25.7 pg — ABNORMAL LOW (ref 26.0–34.0)
MCHC: 29.3 g/dL — ABNORMAL LOW (ref 30.0–36.0)
MCV: 87.7 fL (ref 80.0–100.0)
Monocytes Absolute: 0.6 10*3/uL (ref 0.1–1.0)
Monocytes Relative: 5 %
Neutro Abs: 10.7 10*3/uL — ABNORMAL HIGH (ref 1.7–7.7)
Neutrophils Relative %: 89 %
Platelets: 667 10*3/uL — ABNORMAL HIGH (ref 150–400)
RBC: 3.81 MIL/uL — ABNORMAL LOW (ref 4.22–5.81)
RDW: 15.5 % (ref 11.5–15.5)
WBC: 12 10*3/uL — ABNORMAL HIGH (ref 4.0–10.5)
nRBC: 0 % (ref 0.0–0.2)

## 2022-01-30 LAB — COMPREHENSIVE METABOLIC PANEL
ALT: 21 U/L (ref 0–44)
AST: 20 U/L (ref 15–41)
Albumin: 2.7 g/dL — ABNORMAL LOW (ref 3.5–5.0)
Alkaline Phosphatase: 117 U/L (ref 38–126)
Anion gap: 4 — ABNORMAL LOW (ref 5–15)
BUN: 42 mg/dL — ABNORMAL HIGH (ref 8–23)
CO2: 26 mmol/L (ref 22–32)
Calcium: 8.4 mg/dL — ABNORMAL LOW (ref 8.9–10.3)
Chloride: 109 mmol/L (ref 98–111)
Creatinine, Ser: 1.3 mg/dL — ABNORMAL HIGH (ref 0.61–1.24)
GFR, Estimated: 54 mL/min — ABNORMAL LOW (ref >60)
Glucose, Bld: 115 mg/dL — ABNORMAL HIGH (ref 70–99)
Potassium: 5.7 mmol/L — ABNORMAL HIGH (ref 3.5–5.1)
Sodium: 139 mmol/L (ref 135–145)
Total Bilirubin: 0.4 mg/dL (ref 0.3–1.2)
Total Protein: 6.9 g/dL (ref 6.5–8.1)

## 2022-01-30 MED ORDER — ONDANSETRON HCL 4 MG PO TABS: 4.0000 mg | ORAL_TABLET | Freq: Four times a day (QID) | ORAL | Status: DC | PRN

## 2022-01-30 MED ORDER — ACETAMINOPHEN 325 MG PO TABS: 650.0000 mg | ORAL_TABLET | Freq: Four times a day (QID) | ORAL | Status: DC | PRN

## 2022-01-30 MED ORDER — DEXAMETHASONE SODIUM PHOSPHATE 4 MG/ML IJ SOLN
4.0000 mg | Freq: Three times a day (TID) | INTRAMUSCULAR | Status: DC
  Administered 2022-01-31 – 2022-02-01 (×6): 4 mg via INTRAVENOUS
  Filled 2022-01-30 (×6): qty 1

## 2022-01-30 MED ORDER — HEPARIN SODIUM (PORCINE) 5000 UNIT/ML IJ SOLN
5000.0000 [IU] | Freq: Three times a day (TID) | INTRAMUSCULAR | Status: DC
  Administered 2022-01-31: 5000 [IU] via SUBCUTANEOUS
  Filled 2022-01-30: qty 1

## 2022-01-30 MED ORDER — SODIUM CHLORIDE 0.9 % IV SOLN: INTRAVENOUS | Status: DC

## 2022-01-30 MED ORDER — SODIUM CHLORIDE 0.9 % IV BOLUS
1000.0000 mL | Freq: Once | INTRAVENOUS | Status: AC
  Administered 2022-01-30: 1000 mL via INTRAVENOUS

## 2022-01-30 MED ORDER — ONDANSETRON HCL 4 MG/2ML IJ SOLN: 4.0000 mg | Freq: Four times a day (QID) | INTRAMUSCULAR | Status: DC | PRN

## 2022-01-30 MED ORDER — ALBUTEROL SULFATE (2.5 MG/3ML) 0.083% IN NEBU: 2.5000 mg | INHALATION_SOLUTION | RESPIRATORY_TRACT | Status: DC | PRN

## 2022-01-30 MED ORDER — ACETAMINOPHEN 650 MG RE SUPP: 650.0000 mg | Freq: Four times a day (QID) | RECTAL | Status: DC | PRN

## 2022-01-30 NOTE — H&P (Addendum)
History and Physical    [name] [name] MHD:[id number] DOB: September 24, 1933 DOA: 01/28/2022  PCP: Roselee Nova, MD  Patient coming from: home   I have personally briefly reviewed patient's old medical records in [place]  Chief Complaint: change in MS  HPI: [name] [name] is a 88 y.o. male with medical history significant of HLD, HTN ,  lung cancer (declined treatment per chart review), rhabdomyolysis, schizophrenia, and congestive heart failure ef 255 on echo 2019, who presents to ED BIB EMS due to decrease po intake and confusion from baseline as well as generalized weakness. Per chart patient is currently being followed by hospice.Patient is not able to give history at this time. Per notes patient was given ativan by hospice RN at 1900.    ED Course:  Afeb, bp 95.74, hr 63, rr 19 sat 97%  EKG: sinus ,? St change inferior leads  QJ:JHER upper lobe, right upper lobe, and right infrahilar known masses in the lungs with left upper lobe collapse, unchanged from prior CT. No definite acute infiltrate is seen. Labs: Wbc 12, Hgb 9.8, plt 667 increase pmn N a 139, K 5.7, glu 115, cr 1.3 (0.96)  CTH Findings concerning for metastatic disease in the brain, with the dominant lesion in the posterior left frontal lobe, measuring up to 2.5 cm, with significant surrounding edema, and an additional subcentimeter lesion in the left midbrain. This could be further evaluated with MRI with and without contrast if clinically indicated.   Review of Systems: As per HPI otherwise 10 point review of systems negative.   Past Medical History:  Diagnosis Date   HLD (hyperlipidemia)    Hypertensive heart disease with heart failure ([place]) 01/01/2018   Nodule of upper lobe of left lung 2009   PET avid concerning for cancer, decision was made at this time for no further investigation as he would not want treatment     No past surgical history on file.   reports that he has been smoking  cigarettes. He has been smoking an average of .5 packs per day. He has never used smokeless tobacco. He reports current alcohol use. He reports that he does not use drugs.  No Known Allergies  Family History  Problem Relation Age of Onset   Cancer Brother    Cancer Daughter     Prior to Admission medications   Medication Sig Start Date End Date Taking? Authorizing Provider  albuterol (VENTOLIN HFA) 108 (90 Base) MCG/ACT inhaler SMARTSIG:2 Puff(s) By Mouth Every 6 Hours PRN 08/17/21   [provider]  aspirin 81 MG chewable tablet Chew 1 tablet (81 mg total) by mouth daily. 12/31/17   Patrecia Pour, MD  docusate sodium (COLACE) 100 MG capsule Take 100 mg by mouth 2 (two) times daily.    [provider]  ferrous sulfate 325 (65 FE) MG EC tablet Take 325 mg by mouth 3 (three) times daily with meals.    [provider]  losartan (COZAAR) 25 MG tablet Take 12.5 mg by mouth daily.    [provider]  metoprolol succinate (TOPROL-XL) 25 MG 24 hr tablet Take 1 tablet (25 mg total) by mouth daily. 12/31/17   Patrecia Pour, MD    Physical Exam: Vitals:   02/07/2022 2035 02/09/2022 2143 02/16/2022 2200 02/18/2022 2245  BP: 95/74 129/68 (!) 144/97 (!) 148/71  Pulse: 63 95 (!) 146 90  Resp: 19 20 (!) 26 16  Temp: 98.7 F (37.1 C)  TempSrc: Oral     SpO2: 97% 100% 99% 99%  Weight:      Height:        Constitutional: NAD, obtunded ,responds to noxious stimuli, cachectic Vitals:   02/09/2022 2035 01/29/2022 2143 01/31/2022 2200 02/04/2022 2245  BP: 95/74 129/68 (!) 144/97 (!) 148/71  Pulse: 63 95 (!) 146 90  Resp: 19 20 (!) 26 16  Temp: 98.7 F (37.1 C)     TempSrc: Oral     SpO2: 97% 100% 99% 99%  Weight:      Height:       Eyes: pupils equal, lids and conjunctivae normal ENMT: Mucous membranes are dry.  Neck: normal, supple, no masses, no thyromegaly Respiratory: clear to auscultation bilaterally, no wheezing, no crackles. Normal respiratory effort. No  accessory muscle use.  Cardiovascular: tachycardiac ,Regular  rhythm, no murmurs / rubs / gallops. No extremity edema.   Abdomen: no tenderness, no masses palpated. No hepatosplenomegaly. Bowel sounds positive.  Musculoskeletal: no clubbing / cyanosis. No joint deformity upper and lower extremities. Good ROM, no contractures. Normal muscle tone.  Skin: no rashes, lesions, ulcers. No induration Neurologic: MAE, cn grossly intact , unable to do further assesment Psychiatric: unable to assess   Labs on Admission: I have personally reviewed following labs and imaging studies  CBC: Recent Labs  Lab 01/23/2022 2127  WBC 12.0*  NEUTROABS 10.7*  HGB 9.8*  HCT 33.4*  MCV 87.7  PLT 161*   Basic Metabolic Panel: Recent Labs  Lab 01/29/2022 2127  NA 139  K 5.7*  CL 109  CO2 26  GLUCOSE 115*  BUN 42*  CREATININE 1.30*  CALCIUM 8.4*   GFR: Estimated Creatinine Clearance: 30.1 mL/min (A) (by C-G formula based on SCr of 1.3 mg/dL (H)). Liver Function Tests: Recent Labs  Lab 02/10/2022 2127  AST 20  ALT 21  ALKPHOS 117  BILITOT 0.4  PROT 6.9  ALBUMIN 2.7*   No results for input(s): "LIPASE", "AMYLASE" in the last 168 hours. No results for input(s): "AMMONIA" in the last 168 hours. Coagulation Profile: No results for input(s): "INR", "PROTIME" in the last 168 hours. Cardiac Enzymes: No results for input(s): "CKTOTAL", "CKMB", "CKMBINDEX", "TROPONINI" in the last 168 hours. BNP (last 3 results) No results for input(s): "PROBNP" in the last 8760 hours. HbA1C: No results for input(s): "HGBA1C" in the last 72 hours. CBG: No results for input(s): "GLUCAP" in the last 168 hours. Lipid Profile: No results for input(s): "CHOL", "HDL", "LDLCALC", "TRIG", "CHOLHDL", "LDLDIRECT" in the last 72 hours. Thyroid Function Tests: No results for input(s): "TSH", "T4TOTAL", "FREET4", "T3FREE", "THYROIDAB" in the last 72 hours. Anemia Panel: No results for input(s): "VITAMINB12", "FOLATE",  "FERRITIN", "TIBC", "IRON", "RETICCTPCT" in the last 72 hours. Urine analysis:    Component Value Date/Time   COLORURINE YELLOW 12/22/2017 2020   APPEARANCEUR CLEAR 12/22/2017 2020   LABSPEC 1.005 12/22/2017 2020   PHURINE 5.0 12/22/2017 2020   GLUCOSEU NEGATIVE 12/22/2017 2020   HGBUR SMALL (A) 12/22/2017 2020   BILIRUBINUR NEGATIVE 12/22/2017 2020   KETONESUR NEGATIVE 12/22/2017 2020   PROTEINUR NEGATIVE 12/22/2017 2020   UROBILINOGEN 0.2 11/30/2011 0126   NITRITE NEGATIVE 12/22/2017 2020   LEUKOCYTESUR NEGATIVE 12/22/2017 2020    Radiological Exams on Admission: CT Head Wo Contrast  Result Date: 01/24/2022 CLINICAL DATA:  Decreased activity and p.o. intake; history of lung cancer EXAM: CT HEAD WITHOUT CONTRAST TECHNIQUE: Contiguous axial images were obtained from the base of the skull through the vertex without intravenous  contrast. RADIATION DOSE REDUCTION: This exam was performed according to the departmental dose-optimization program which includes automated exposure control, adjustment of the mA and/or kV according to patient size and/or use of iterative reconstruction technique. COMPARISON:  12/22/2017 CT head FINDINGS: Brain: Masslike hypodensity in the posterior left frontal lobe, with slightly increased density peripherally, which measures approximately 2.4 x 1.9 x 2.5 cm (series 2, image 26 and series 5, image 44), concerning for a metastatic lesion. Significant adjacent hypodensity, most likely associated edema, which extends into the frontal and parietal lobes. Mild mass effect on the left lateral ventricle. Additional 0.8 cm hypodense lesion in the left midbrain (series 2, image 14). These lesions are new from the prior CT head No acute infarct, hemorrhage, or midline shift. No hydrocephalus or extra-axial collection. Vascular: No hyperdense vessel. Skull: No acute fracture or suspicious lesion. Sinuses/Orbits: No acute finding. Other: The mastoids are well aerated. IMPRESSION:  Findings concerning for metastatic disease in the brain, with the dominant lesion in the posterior left frontal lobe, measuring up to 2.5 cm, with significant surrounding edema, and an additional subcentimeter lesion in the left midbrain. This could be further evaluated with MRI with and without contrast if clinically indicated. These results were called by telephone at the time of interpretation on 02/14/2022 at 10:05 pm to provider [facility] ZAMMIT , who verbally acknowledged these results. Electronically Signed   By: Merilyn Baba M.D.   On: 02/05/2022 22:04   DG Chest Port 1 View  Result Date: 02/14/2022 CLINICAL DATA:  Weakness. EXAM: PORTABLE CHEST 1 VIEW COMPARISON:  07/26/2021, 08/23/2021. FINDINGS: The heart is enlarged and the mediastinal contour stable. There is atherosclerotic calcification of the aorta. There is redemonstration of a left upper lobe mass with left upper lobe collapse. A new masslike region is seen in the right upper lobe and in the infrahilar region on the right. No effusion or pneumothorax. No acute osseous abnormality. IMPRESSION: Left upper lobe, right upper lobe, and right infrahilar known masses in the lungs with left upper lobe collapse, unchanged from prior CT. No definite acute infiltrate is seen. Electronically Signed   By: Brett Fairy M.D.   On: 01/22/2022 21:38    EKG: Independently reviewed. See above  Assessment/Plan  Acute Encephalopathy -multifactorial, new brain mets, poor po intake with severe dehydration  -supportive care for underlying cause    New metastasis to the Brain -noted area of edema -start decadron  -DNI/DNR  -poor prognosis  -palliative care consult   Failure to thrive  -insetting of inoperable lung ca  with mets to brain  -not on treatment due to patient preference   AKI  -hold nephrotoxic medications  - ivfs   Hyperkalemia -repeat labs pending  -if still elevated can consider lokelma   HLD/HTN -hold med currently  -family  leaning towards comfort care   Schizophrenia -resume home regimen as able once med completed   Congestive heart failure  -ef 25% on echo 2019, -patient currently dehydrated   DVT prophylaxis: heparin Code Status: DNI/DNR Family Communication: n/a Disposition Plan: patient  expected to be admitted greater than 2 midnights  Consults called: palliative care  Admission status: med surg    Clance Boll MD [facility]   If 7PM-7AM, please contact night-coverage [email or web address] Password [email or web address]  01/29/2022, 11:18 PM

## 2022-01-30 NOTE — ED Provider Notes (Signed)
[facility] DEPT Provider Note   CSN: [id number] Arrival date & time: 01/31/2022  2010     History  Chief Complaint  Patient presents with   Altered Mental Status   Failure To Thrive    ZARYAN YAKUBOV is a 88 y.o. male.  Patient has a history of lung cancer and congestive heart failure.  He is decided not to get any treatment for his lung cancer and wishes to be a DNR.  He lives at home by himself but has hospice come by and a friend come by.  He is more altered and weak now  The history is provided by a relative. No language interpreter was used.  Altered Mental Status Presenting symptoms: behavior changes   Severity:  Severe Most recent episode: Unknown. Timing:  Constant Progression:  Worsening Chronicity:  Recurrent Context: not alcohol use   Associated symptoms: no abdominal pain        Home Medications Prior to Admission medications   Medication Sig Start Date End Date Taking? Authorizing Provider  albuterol (VENTOLIN HFA) 108 (90 Base) MCG/ACT inhaler SMARTSIG:2 Puff(s) By Mouth Every 6 Hours PRN 08/17/21   [provider]  aspirin 81 MG chewable tablet Chew 1 tablet (81 mg total) by mouth daily. 12/31/17   Patrecia Pour, MD  docusate sodium (COLACE) 100 MG capsule Take 100 mg by mouth 2 (two) times daily.    [provider]  ferrous sulfate 325 (65 FE) MG EC tablet Take 325 mg by mouth 3 (three) times daily with meals.    [provider]  losartan (COZAAR) 25 MG tablet Take 12.5 mg by mouth daily.    [provider]  metoprolol succinate (TOPROL-XL) 25 MG 24 hr tablet Take 1 tablet (25 mg total) by mouth daily. 12/31/17   Patrecia Pour, MD      Allergies    Patient has no known allergies.    Review of Systems   Review of Systems  Unable to perform ROS: Mental status change  Gastrointestinal:  Negative for abdominal pain.    Physical Exam Updated Vital Signs BP (!) 148/71   Pulse 90    Temp 98.7 F (37.1 C) (Oral)   Resp 16   Ht 6' (1.829 m)   Wt 52.2 kg   SpO2 99%   BMI 15.60 kg/m  Physical Exam Vitals and nursing note reviewed.  Constitutional:      Appearance: He is well-developed.     Comments: Lethargic  HENT:     Head: Normocephalic.     Nose: Nose normal.     Mouth/Throat:     Comments: Dry mucous membranes Eyes:     General: No scleral icterus.    Conjunctiva/sclera: Conjunctivae normal.  Neck:     Thyroid: No thyromegaly.  Cardiovascular:     Rate and Rhythm: Normal rate and regular rhythm.     Heart sounds: No murmur heard.    No friction rub. No gallop.  Pulmonary:     Breath sounds: No stridor. No wheezing or rales.  Chest:     Chest wall: No tenderness.  Abdominal:     General: There is no distension.     Tenderness: There is no abdominal tenderness. There is no rebound.  Musculoskeletal:        General: Normal range of motion.     Cervical back: Neck supple.  Lymphadenopathy:     Cervical: No cervical adenopathy.  Skin:    Findings:  No erythema or rash.  Neurological:     Motor: No abnormal muscle tone.     Coordination: Coordination normal.     Comments: You can arouse the patient and he can answer a few questions.  He is extremely lethargic  Psychiatric:        Behavior: Behavior normal.     ED Results / Procedures / Treatments   Labs (all labs ordered are listed, but only abnormal results are displayed) Labs Reviewed  CBC WITH DIFFERENTIAL/PLATELET - Abnormal; Notable for the following components:      Result Value   WBC 12.0 (*)    RBC 3.81 (*)    Hemoglobin 9.8 (*)    HCT 33.4 (*)    MCH 25.7 (*)    MCHC 29.3 (*)    Platelets 667 (*)    Neutro Abs 10.7 (*)    Lymphs Abs 0.6 (*)    Abs Immature Granulocytes 0.08 (*)    All other components within normal limits  COMPREHENSIVE METABOLIC PANEL - Abnormal; Notable for the following components:   Potassium 5.7 (*)    Glucose, Bld 115 (*)    BUN 42 (*)     Creatinine, Ser 1.30 (*)    Calcium 8.4 (*)    Albumin 2.7 (*)    GFR, Estimated 54 (*)    Anion gap 4 (*)    All other components within normal limits    EKG None  Radiology CT Head Wo Contrast  Result Date: 01/25/2022 CLINICAL DATA:  Decreased activity and p.o. intake; history of lung cancer EXAM: CT HEAD WITHOUT CONTRAST TECHNIQUE: Contiguous axial images were obtained from the base of the skull through the vertex without intravenous contrast. RADIATION DOSE REDUCTION: This exam was performed according to the departmental dose-optimization program which includes automated exposure control, adjustment of the mA and/or kV according to patient size and/or use of iterative reconstruction technique. COMPARISON:  12/22/2017 CT head FINDINGS: Brain: Masslike hypodensity in the posterior left frontal lobe, with slightly increased density peripherally, which measures approximately 2.4 x 1.9 x 2.5 cm (series 2, image 26 and series 5, image 44), concerning for a metastatic lesion. Significant adjacent hypodensity, most likely associated edema, which extends into the frontal and parietal lobes. Mild mass effect on the left lateral ventricle. Additional 0.8 cm hypodense lesion in the left midbrain (series 2, image 14). These lesions are new from the prior CT head No acute infarct, hemorrhage, or midline shift. No hydrocephalus or extra-axial collection. Vascular: No hyperdense vessel. Skull: No acute fracture or suspicious lesion. Sinuses/Orbits: No acute finding. Other: The mastoids are well aerated. IMPRESSION: Findings concerning for metastatic disease in the brain, with the dominant lesion in the posterior left frontal lobe, measuring up to 2.5 cm, with significant surrounding edema, and an additional subcentimeter lesion in the left midbrain. This could be further evaluated with MRI with and without contrast if clinically indicated. These results were called by telephone at the time of interpretation on  02/03/2022 at 10:05 pm to provider [facility] Kaleya Douse , who verbally acknowledged these results. Electronically Signed   By: Merilyn Baba M.D.   On: 02/20/2022 22:04   DG Chest Port 1 View  Result Date: 01/25/2022 CLINICAL DATA:  Weakness. EXAM: PORTABLE CHEST 1 VIEW COMPARISON:  07/26/2021, 08/23/2021. FINDINGS: The heart is enlarged and the mediastinal contour stable. There is atherosclerotic calcification of the aorta. There is redemonstration of a left upper lobe mass with left upper lobe collapse. A new masslike region  is seen in the right upper lobe and in the infrahilar region on the right. No effusion or pneumothorax. No acute osseous abnormality. IMPRESSION: Left upper lobe, right upper lobe, and right infrahilar known masses in the lungs with left upper lobe collapse, unchanged from prior CT. No definite acute infiltrate is seen. Electronically Signed   By: Brett Fairy M.D.   On: 02/04/2022 21:38    Procedures Procedures    Medications Ordered in ED Medications  sodium chloride 0.9 % bolus 1,000 mL (0 mLs Intravenous Stopped 02/05/2022 2240)    ED Course/ Medical Decision Making/ A&P                           Medical Decision Making Amount and/or Complexity of Data Reviewed Labs: ordered. Radiology: ordered. ECG/medicine tests: ordered.  Risk Decision regarding hospitalization.     This patient presents to the ED for concern of lethargy, this involves an extensive number of treatment options, and is a complaint that carries with it a high risk of complications and morbidity.  The differential diagnosis includes infection, worsening lung cancer   Co morbidities that complicate the patient evaluation  Cancer   Additional history obtained:  Additional history obtained from niece External records from outside source obtained and reviewed including hospital records   Lab Tests:  I Ordered, and personally interpreted labs.  The pertinent results include: Potassium 5.7,  creatinine 1.3, BUN 47   Imaging Studies ordered:  I ordered imaging studies including CT head I independently visualized and interpreted imaging which showed metastatic disease in his head from the lung cancer I agree with the radiologist interpretation   Cardiac Monitoring: / EKG:  The patient was maintained on a cardiac monitor.  I personally viewed and interpreted the cardiac monitored which showed an underlying rhythm of: Sinus tach   Consultations Obtained:  I requested consultation with the hospitalist,  and discussed lab and imaging findings as well as pertinent plan - they recommend: Admit   Problem List / ED Course / Critical interventions / Medication management  Worsening lung cancer I ordered medication including normal saline for dehydration Reevaluation of the patient after these medicines showed that the patient stayed the same I have reviewed the patients home medicines and have made adjustments as needed   Social Determinants of Health:  Lives alone   Test / Admission - Considered:  No additional test needed now  Patient with dehydration and metastatic lung cancer.  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  He is a DNR and I explained this to his niece who states she is not the medical power of attorney but the patient is.  He will be admitted for hydration and comfort care        Final Clinical Impression(s) / ED Diagnoses Final diagnoses:  Dehydration  Primary malignant neoplasm of lung metastatic to other site, unspecified laterality [facility])    Rx / DC Orders ED Discharge Orders     None         Milton Ferguson, MD 02/04/22 1104

## 2022-01-30 NOTE — ED Triage Notes (Signed)
Patient BIB EMS from home c/o AMS. Per report pt decrease on activity and P.O. Intake. Per report hospice RN gave pt ativan at 1900. Pt still Full Code. Patient hx Lung CA. Pt is on hospice care at home.

## 2022-01-31 DIAGNOSIS — Z7189 Other specified counseling: Secondary | ICD-10-CM

## 2022-01-31 DIAGNOSIS — C3490 Malignant neoplasm of unspecified part of unspecified bronchus or lung: Secondary | ICD-10-CM

## 2022-01-31 DIAGNOSIS — C7931 Secondary malignant neoplasm of brain: Secondary | ICD-10-CM

## 2022-01-31 DIAGNOSIS — R52 Pain, unspecified: Secondary | ICD-10-CM

## 2022-01-31 DIAGNOSIS — R451 Restlessness and agitation: Secondary | ICD-10-CM

## 2022-01-31 DIAGNOSIS — Z515 Encounter for palliative care: Principal | ICD-10-CM

## 2022-01-31 DIAGNOSIS — Z66 Do not resuscitate: Secondary | ICD-10-CM

## 2022-01-31 DIAGNOSIS — E860 Dehydration: Secondary | ICD-10-CM

## 2022-01-31 DIAGNOSIS — Z79899 Other long term (current) drug therapy: Secondary | ICD-10-CM

## 2022-01-31 DIAGNOSIS — R0600 Dyspnea, unspecified: Secondary | ICD-10-CM

## 2022-01-31 DIAGNOSIS — R627 Adult failure to thrive: Secondary | ICD-10-CM

## 2022-01-31 LAB — CBC
HCT: 40.9 % (ref 39.0–52.0)
Hemoglobin: 11.9 g/dL — ABNORMAL LOW (ref 13.0–17.0)
MCH: 25.9 pg — ABNORMAL LOW (ref 26.0–34.0)
MCHC: 29.1 g/dL — ABNORMAL LOW (ref 30.0–36.0)
MCV: 88.9 fL (ref 80.0–100.0)
Platelets: 705 10*3/uL — ABNORMAL HIGH (ref 150–400)
RBC: 4.6 MIL/uL (ref 4.22–5.81)
RDW: 15.8 % — ABNORMAL HIGH (ref 11.5–15.5)
WBC: 12.6 10*3/uL — ABNORMAL HIGH (ref 4.0–10.5)
nRBC: 0 % (ref 0.0–0.2)

## 2022-01-31 LAB — COMPREHENSIVE METABOLIC PANEL
ALT: 20 U/L (ref 0–44)
ALT: 23 U/L (ref 0–44)
AST: 20 U/L (ref 15–41)
AST: 23 U/L (ref 15–41)
Albumin: 2.8 g/dL — ABNORMAL LOW (ref 3.5–5.0)
Albumin: 2.9 g/dL — ABNORMAL LOW (ref 3.5–5.0)
Alkaline Phosphatase: 122 U/L (ref 38–126)
Alkaline Phosphatase: 143 U/L — ABNORMAL HIGH (ref 38–126)
Anion gap: 6 (ref 5–15)
Anion gap: 10 (ref 5–15)
BUN: 39 mg/dL — ABNORMAL HIGH (ref 8–23)
BUN: 36 mg/dL — ABNORMAL HIGH (ref 8–23)
CO2: 24 mmol/L (ref 22–32)
CO2: 22 mmol/L (ref 22–32)
Calcium: 8.2 mg/dL — ABNORMAL LOW (ref 8.9–10.3)
Calcium: 8.8 mg/dL — ABNORMAL LOW (ref 8.9–10.3)
Chloride: 109 mmol/L (ref 98–111)
Chloride: 108 mmol/L (ref 98–111)
Creatinine, Ser: 1.17 mg/dL (ref 0.61–1.24)
Creatinine, Ser: 1.07 mg/dL (ref 0.61–1.24)
GFR, Estimated: >60 mL/min (ref >60)
GFR, Estimated: >60 mL/min (ref >60)
Glucose, Bld: 106 mg/dL — ABNORMAL HIGH (ref 70–99)
Glucose, Bld: 112 mg/dL — ABNORMAL HIGH (ref 70–99)
Potassium: 5.6 mmol/L — ABNORMAL HIGH (ref 3.5–5.1)
Potassium: 5.9 mmol/L — ABNORMAL HIGH (ref 3.5–5.1)
Sodium: 139 mmol/L (ref 135–145)
Sodium: 140 mmol/L (ref 135–145)
Total Bilirubin: 0.5 mg/dL (ref 0.3–1.2)
Total Bilirubin: 0.4 mg/dL (ref 0.3–1.2)
Total Protein: 6.9 g/dL (ref 6.5–8.1)
Total Protein: 7.3 g/dL (ref 6.5–8.1)

## 2022-01-31 LAB — PROTIME-INR
INR: 1.1 (ref 0.8–1.2)
Prothrombin Time: 14.3 seconds (ref 11.4–15.2)

## 2022-01-31 LAB — CBC WITH DIFFERENTIAL/PLATELET
Abs Immature Granulocytes: 0.1 10*3/uL — ABNORMAL HIGH (ref 0.00–0.07)
Basophils Absolute: 0 10*3/uL (ref 0.0–0.1)
Basophils Relative: 0 %
Eosinophils Absolute: 0 10*3/uL (ref 0.0–0.5)
Eosinophils Relative: 0 %
HCT: 40.7 % (ref 39.0–52.0)
Hemoglobin: 11.7 g/dL — ABNORMAL LOW (ref 13.0–17.0)
Immature Granulocytes: 1 %
Lymphocytes Relative: 4 %
Lymphs Abs: 0.5 10*3/uL — ABNORMAL LOW (ref 0.7–4.0)
MCH: 25.5 pg — ABNORMAL LOW (ref 26.0–34.0)
MCHC: 28.7 g/dL — ABNORMAL LOW (ref 30.0–36.0)
MCV: 88.7 fL (ref 80.0–100.0)
Monocytes Absolute: 0.6 10*3/uL (ref 0.1–1.0)
Monocytes Relative: 6 %
Neutro Abs: 9.4 10*3/uL — ABNORMAL HIGH (ref 1.7–7.7)
Neutrophils Relative %: 89 %
Platelets: 652 10*3/uL — ABNORMAL HIGH (ref 150–400)
RBC: 4.59 MIL/uL (ref 4.22–5.81)
RDW: 15.5 % (ref 11.5–15.5)
WBC: 10.6 10*3/uL — ABNORMAL HIGH (ref 4.0–10.5)
nRBC: 0 % (ref 0.0–0.2)

## 2022-01-31 LAB — HEMOGLOBIN A1C
Hgb A1c MFr Bld: 6.1 % — ABNORMAL HIGH (ref 4.8–5.6)
Mean Plasma Glucose: 128.37 mg/dL

## 2022-01-31 LAB — BRAIN NATRIURETIC PEPTIDE: B Natriuretic Peptide: 323.5 pg/mL — ABNORMAL HIGH (ref 0.0–100.0)

## 2022-01-31 LAB — CBG MONITORING, ED: Glucose-Capillary: 103 mg/dL — ABNORMAL HIGH (ref 70–99)

## 2022-01-31 MED ORDER — HALOPERIDOL LACTATE 5 MG/ML IJ SOLN: 0.5000 mg | INTRAMUSCULAR | Status: DC | PRN

## 2022-01-31 MED ORDER — HYDROMORPHONE HCL 1 MG/ML IJ SOLN: 0.2000 mg | INTRAMUSCULAR | Status: DC | PRN

## 2022-01-31 MED ORDER — SODIUM ZIRCONIUM CYCLOSILICATE 10 G PO PACK
10.0000 g | PACK | Freq: Once | ORAL | Status: AC
  Administered 2022-01-31: 10 g via ORAL
  Filled 2022-01-31: qty 1

## 2022-01-31 MED ORDER — BIOTENE DRY MOUTH MT LIQD: 15.0000 mL | OROMUCOSAL | Status: DC | PRN

## 2022-01-31 MED ORDER — GLYCOPYRROLATE 0.2 MG/ML IJ SOLN: 0.2000 mg | INTRAMUSCULAR | Status: DC | PRN

## 2022-01-31 MED ORDER — POLYVINYL ALCOHOL 1.4 % OP SOLN: 1.0000 [drp] | Freq: Four times a day (QID) | OPHTHALMIC | Status: DC | PRN

## 2022-01-31 MED ORDER — HYDROMORPHONE HCL-NACL 50-0.9 MG/50ML-% IV SOLN: 0.2000 mg/h | INTRAVENOUS | Status: DC

## 2022-01-31 MED ORDER — LORAZEPAM 2 MG/ML IJ SOLN
0.5000 mg | INTRAMUSCULAR | Status: DC | PRN
  Administered 2022-02-01: 0.5 mg via INTRAVENOUS
  Filled 2022-01-31: qty 1

## 2022-01-31 MED ORDER — HYDROMORPHONE HCL-NACL 50-0.9 MG/50ML-% IV SOLN: 0.5000 mg/h | INTRAVENOUS | Status: DC

## 2022-01-31 MED ORDER — SODIUM CHLORIDE 0.9 % IV SOLN
0.2000 mg/h | INTRAVENOUS | Status: DC
  Administered 2022-01-31: 0.2 mg/h via INTRAVENOUS
  Filled 2022-01-31: qty 5

## 2022-01-31 NOTE — ED Notes (Signed)
In room to draw blood and patient has pulled off gown, pulled out IV, pulled off perwick, and all monitor equipment

## 2022-01-31 NOTE — Consult Note (Signed)
Consultation Note Date: 01/31/2022   Patient Name: [name] [name]  DOB: 1933-08-19  MRN: [id number]  Age / Sex: 88 y.o., male   PCP: Roselee Nova, MD Referring Physician: Geradine Girt, DO  Reason for Consultation: Establishing goals of care     Chief Complaint/History of Present Illness:   Patient is an 88 year old male with a past medical history significant for hyperlipidemia, hypertension, rhabdomyolysis, schizophrenia, congestive heart failure, and lung cancer (declined cancer directed therapies) who was admitted on 01/23/2022 due to increased confusion and decreased p.o. intake.  As per chart review patient was at home with [facility] hospice support.  Imaging obtained in ER has shown new metastatic disease to the brain associated with edema.  Palliative care consulted to assist with complex medical decision-making.  Extensive review of EMR prior to seeing patient.  Discussed with bedside RN as well.  Resented to bedside to check on patient.  No family present at bedside during initial visit.  When seeing patient he is laying in bed in an almost fetal position.  He appears agitated with increased work of breathing.  Patient is in mittens for agitation.  Patient able to speak small words and follow simple commands like looking at this provider though unable to participate in complex medical decision-making.  Noted would reach out to his niece listed in EMR as contact. Attempted to call niece, [name] [name], without answer on initial call.  ------------------------------------------------------------------------------------------------------------- Advance Care Planning Conversation  Pertinent diagnosis: Static lung cancer with new brain mets causing confusion  The patient and/or family consented to a voluntary Varina. Individuals present for the conversation: Patient unable to participate in complex medical decision making with his underlying mental  status.  Present for conversation was patient's niece/primary contact, [name] [name], her brother (patient's nephew) and patient's day-to-day caregiver.  This provider was present for entirety of conversation.  Summary of the conversation:  Later in day bedside RN was able to update this provider that family had presented to bedside.  Able to present to bedside again and reintroduced myself and the role of palliative care team in patient's management.  When initially presenting at bedside [facility] hospice representative was present so was able to gather details about patient's home environment from her before she left and I spoke with family directly. [name] [name] stated that she and her brother assist in making medical decisions for the patient while [name] [name] is the day-to-day care Freight forwarder.  [name] [name] was able to provide details that patient had been eating and drinking less over the past couple of weeks and becoming more fatigued.  He described that the hospice nurse had been at bedside yesterday for his evaluation and expressed concern over the patient's work of breathing and inquired if patient would be able to have caregiver present 24/7.  Family is not able to provide this and so patient was instead sent to the ER.  We discussed the findings on CT imaging.  Family concerned that patient has not never wanted cancer directed therapies.  We discussed the idea of transitioning patient to comfort focused care at this time until the end of his life.  Discussed that with these new brain mets there will be continued progression with possible increased lethargy, confusion, decreased p.o. intake, and these are all signs of time growing short.  Discussed with discontinue interventions such as IV fluids, imaging, and lab work, and instead transition focus of care to pain, dyspnea, and agitation management.  Patient currently in mittens  so discussed hopefully can prove agitation enough to get mittens removed as they are not  comfortable.  Raquel Sarna mention that hospice had stated patient may need a facility like [facility].  Was able to describe that to go to an inpatient hospice facility, patient must be excepted there for GIP criteria, not just need 24/7 caregiving.  Patient will remain in the hospital at this time and will initiate comfort care measures and will continue to monitor to determine if patient can be transferred to a facility like [facility].  Family agreeing with this plan.  All questions answered at that time.  Thanked family for allowing me to discuss patient's care with them today.  Outcome of the conversations and/or documents completed:  Transitioning to comfort focused care at this time.  Patient will remain in hospital as family unable to provide 24/7 care and may potentially be transferred to inpatient hospice facility if accepted under GIP criteria.  I spent 23 minutes providing separately identifiable ACP services with the patient and/or surrogate decision maker in a voluntary, in-person conversation discussing the patient's wishes and goals as detailed in the above note.  Chelsea Aus, DO Palliative Care Provider  -------------------------------------------------------------------------------------------------------------  Primary Diagnoses  Present on Admission:  Failure to thrive in adult   Palliative Review of Systems: Unable to obtain full ROS due to patient's mental status.  Patient does appear agitated with increased work of breathing.  Past Medical History:  Diagnosis Date   HLD (hyperlipidemia)    Hypertensive heart disease with heart failure ([place]) 01/01/2018   Nodule of upper lobe of left lung 2009   PET avid concerning for cancer, decision was made at this time for no further investigation as he would not want treatment    Social History   Socioeconomic History   Marital status: Single    Spouse name: Not on file   Number of children: Not on file   Years of education:  Not on file   Highest education level: Not on file  Occupational History   Not on file  Tobacco Use   Smoking status: Every Day    Packs/day: 0.50    Types: Cigarettes   Smokeless tobacco: Never  Substance and Sexual Activity   Alcohol use: Yes    Comment: beer   Drug use: No   Sexual activity: Not on file  Other Topics Concern   Not on file  Social History Narrative   ** Merged History Encounter **       Social Determinants of Health   Financial Resource Strain: Not on file  Food Insecurity: Not on file  Transportation Needs: Not on file  Physical Activity: Not on file  Stress: Not on file  Social Connections: Not on file   Family History  Problem Relation Age of Onset   Cancer Brother    Cancer Daughter    Scheduled Meds:  dexamethasone (DECADRON) injection  4 mg Intravenous Q8H   heparin  5,000 Units Subcutaneous Q8H   sodium zirconium cyclosilicate  10 g Oral Once   Continuous Infusions:  sodium chloride 75 mL/hr at 01/31/22 0219   PRN Meds:.acetaminophen **OR** acetaminophen, albuterol, ondansetron **OR** ondansetron (ZOFRAN) IV No Known Allergies CBC:    Component Value Date/Time   WBC 12.6 (H) 01/31/2022 0513   HGB 11.9 (L) 01/31/2022 0513   HGB 11.9 (L) 10/23/2021 1255   HCT 40.9 01/31/2022 0513   PLT 705 (H) 01/31/2022 0513   PLT 410 (H) 10/23/2021 1255  MCV 88.9 01/31/2022 0513   NEUTROABS 9.4 (H) 01/31/2022 0132   LYMPHSABS 0.5 (L) 01/31/2022 0132   MONOABS 0.6 01/31/2022 0132   EOSABS 0.0 01/31/2022 0132   BASOSABS 0.0 01/31/2022 0132   Comprehensive Metabolic Panel:    Component Value Date/Time   NA 140 01/31/2022 0513   NA 141 01/04/2018 0000   K 5.9 (H) 01/31/2022 0513   CL 108 01/31/2022 0513   CO2 22 01/31/2022 0513   BUN 36 (H) 01/31/2022 0513   BUN 26 (A) 01/04/2018 0000   CREATININE 1.07 01/31/2022 0513   CREATININE 0.96 10/23/2021 1255   GLUCOSE 112 (H) 01/31/2022 0513   CALCIUM 8.8 (L) 01/31/2022 0513   AST 23 01/31/2022  0513   AST 15 10/23/2021 1255   ALT 23 01/31/2022 0513   ALT 11 10/23/2021 1255   ALKPHOS 143 (H) 01/31/2022 0513   BILITOT 0.4 01/31/2022 0513   BILITOT 0.7 10/23/2021 1255   PROT 7.3 01/31/2022 0513   ALBUMIN 2.9 (L) 01/31/2022 0513    Physical Exam: Vital Signs: BP 124/61   Pulse 75   Temp 98.2 F (36.8 C)   Resp 17   Ht 6' (1.829 m)   Wt 52.2 kg   SpO2 98%   BMI 15.60 kg/m  SpO2: SpO2: 98 % O2 Device: O2 Device: Nasal Cannula O2 Flow Rate: O2 Flow Rate (L/min): 2 L/min Intake/output summary:  Intake/Output Summary (Last 24 hours) at 01/31/2022 1014 Last data filed at 01/28/2022 2240 Gross per 24 hour  Intake 1000 ml  Output --  Net 1000 ml   LBM:   Baseline Weight: Weight: 52.2 kg Most recent weight: Weight: 52.2 kg  General: Frail, chronically ill-appearing, cachectic, confused Eyes: dry mucous membranes Cardiovascular: RRR Respiratory: Increased work of breathing noted Abdomen: distended Extremities: edema in LE b/l Skin: no rashes or lesions on visible skin Neuro: Confused Psych: Agitated          Palliative Performance Scale: 10%               Additional Data Reviewed: Recent Labs    01/31/22 0132 01/31/22 0513  WBC 10.6* 12.6*  HGB 11.7* 11.9*  PLT 652* 705*  NA 139 140  BUN 39* 36*  CREATININE 1.17 1.07    Imaging: CT Head Wo Contrast CLINICAL DATA:  Decreased activity and p.o. intake; history of lung cancer  EXAM: CT HEAD WITHOUT CONTRAST  TECHNIQUE: Contiguous axial images were obtained from the base of the skull through the vertex without intravenous contrast.  RADIATION DOSE REDUCTION: This exam was performed according to the departmental dose-optimization program which includes automated exposure control, adjustment of the mA and/or kV according to patient size and/or use of iterative reconstruction technique.  COMPARISON:  12/22/2017 CT head  FINDINGS: Brain: Masslike hypodensity in the posterior left frontal lobe,  with slightly increased density peripherally, which measures approximately 2.4 x 1.9 x 2.5 cm (series 2, image 26 and series 5, image 44), concerning for a metastatic lesion. Significant adjacent hypodensity, most likely associated edema, which extends into the frontal and parietal lobes. Mild mass effect on the left lateral ventricle. Additional 0.8 cm hypodense lesion in the left midbrain (series 2, image 14). These lesions are new from the prior CT head  No acute infarct, hemorrhage, or midline shift. No hydrocephalus or extra-axial collection.  Vascular: No hyperdense vessel.  Skull: No acute fracture or suspicious lesion.  Sinuses/Orbits: No acute finding.  Other: The mastoids are well aerated.  IMPRESSION: Findings  concerning for metastatic disease in the brain, with the dominant lesion in the posterior left frontal lobe, measuring up to 2.5 cm, with significant surrounding edema, and an additional subcentimeter lesion in the left midbrain. This could be further evaluated with MRI with and without contrast if clinically indicated.  These results were called by telephone at the time of interpretation on 01/29/2022 at 10:05 pm to provider [facility] ZAMMIT , who verbally acknowledged these results.  Electronically Signed   By: Merilyn Baba M.D.   On: 02/07/2022 22:04 DG Chest Port 1 View CLINICAL DATA:  Weakness.  EXAM: PORTABLE CHEST 1 VIEW  COMPARISON:  07/26/2021, 08/23/2021.  FINDINGS: The heart is enlarged and the mediastinal contour stable. There is atherosclerotic calcification of the aorta. There is redemonstration of a left upper lobe mass with left upper lobe collapse. A new masslike region is seen in the right upper lobe and in the infrahilar region on the right. No effusion or pneumothorax. No acute osseous abnormality.  IMPRESSION: Left upper lobe, right upper lobe, and right infrahilar known masses in the lungs with left upper lobe collapse,  unchanged from prior CT. No definite acute infiltrate is seen.  Electronically Signed   By: Brett Fairy M.D.   On: 01/27/2022 21:38    I personally reviewed recent imaging.   Palliative Care Assessment and Plan Summary of Established Goals of Care and Medical Treatment Preferences    Patient is an 88 year old male with a past medical history significant for hyperlipidemia, hypertension, rhabdomyolysis, schizophrenia, congestive heart failure, and lung cancer (declined cancer directed therapies) who was admitted on 02/04/2022 due to increased confusion and decreased p.o. intake.  As per chart review patient was at home with [facility] hosp[facility] support.  Imaging obtained in ER has shown new metastatic disease to the brain associated with edema.  Palliative care consulted to assist with complex medical decision-making.  # Complex medical decision making/goals of care  -Unable to participate in medical decision making secondary to his mental status.  -Spoke with patient's niece, nephew, and daily caretaker as described in detail above in HPI.  Patient's niece and nephew have been assisting in making medical decisions for patient and he is unable.  They do confirm patient did not want any cancer directed therapies or his life prolonged and would want to focus on comfort focused care.  -At this time we will discontinue interventions that are no longer focused on comfort such as IV fluids, imaging, or lab work.  Will instead focus on symptom management of pain, dyspnea, and agitation in the setting of end-of-life care.  - Code Status: DNR   # Symptom management    -Pain/Dyspnea, acute in the setting of metastatic lung cancer                              -Started IV Dilaudid continuous infusion at 0.2 mg/hr due to patient's increased work of breathing when seen and continued agitation from what appears to be pain with signs of grimacing and groaning. -Started IV Dilaudid 0.2 mg every 1 hour as needed  for breakthrough management.  Continue to adjust based on patient's symptom burden.                   -Anxiety/agitation, in the setting of end-of-life care                               -  Started IV Ativan 0.5 mg every 4 hours as needed. Continue to adjust based on patient's symptom burden.                                 -And also has IV Haldol 0.5 mg every 4 hours as needed. Continue to adjust based on patient's symptom burden.                   -Secretions, in the setting of end-of-life care                               -Started IV glycopyrrolate 0.2 mg every 4 hours as needed.  # Psycho-social/Spiritual Support:  - Support System: Niece, nephew, [name] [name]  # Discharge Planning: TBD.  Patient cannot return home as does not have 24/7 caregiving support.  Will initiate comfort focused care here.  Patient will likely either passed away in hospital versus if patient is excepted for GIP admission to inpatient hospice facility.  Thank you for allowing the palliative care team to participate in the care [name].  Chelsea Aus, DO Palliative Care Provider PMT # [phone]  If patient remains symptomatic despite maximum doses, please call PMT at [phone] between 0700 and 1900. Outside of these hours, please call attending, as PMT does not have night coverage.

## 2022-01-31 NOTE — ED Notes (Signed)
Family updated.

## 2022-01-31 NOTE — ED Notes (Signed)
Patient restless in the bed, keep pulling lines and tubes. Will continue to monitor.

## 2022-01-31 NOTE — Progress Notes (Signed)
PROGRESS NOTE    [name] [name]  TMH:[id number] DOB: 29-May-1933 DOA: 02/05/2022 PCP: Roselee Nova, MD    Brief Narrative:  [name] [name] is a 88 y.o. male with medical history significant of HLD, HTN ,  lung cancer (declined treatment per chart review), rhabdomyolysis, schizophrenia, and congestive heart failure ef 15-25 on echo 2019, who presents to ED BIB EMS due to decrease po intake and confusion from baseline as well as generalized weakness. Per chart patient is currently being followed by hospice.Patient is not able to give history at this time.    Assessment and Plan: Acute Encephalopathy -multifactorial, new brain mets, poor po intake with severe dehydration  -supportive care for underlying cause    New metastasis to the Brain -noted area of edema -start decadron  -DNI/DNR  -poor prognosis  -palliative care consult (family does not appear to understand what hospice is and how to manage)   Failure to thrive  -insetting of inoperable lung ca  with mets to brain  -not on treatment due to patient preference  -? If on hospice at home palliative care/TOC consult   AKI  -hold nephrotoxic medications  - ivfs    Hyperkalemia -lokelma if able to take PO   HLD/HTN -hold med currently    Schizophrenia -resume home regimen as able once med recompleted    Congestive heart failure  -ef 15-25% on echo 2019, -patient currently dehydrated - gentle IVF   DVT prophylaxis: heparin injection 5,000 Units Start: 01/31/22 0600    Code Status: DNR   Disposition Plan:  Level of care: Med-Surg Status is: Inpatient Remains inpatient appropriate   Called niece-- caregiver is   Consultants:  Palliative care-- ? On hospice: oscar  Subjective: No sob/no SCP  Objective: Vitals:   01/31/22 0330 01/31/22 0530 01/31/22 0630 01/31/22 0700  BP: (!) 154/76 126/80  136/69  Pulse: 97 94 96 92  Resp: (!) 21 (!) 21 20 20   Temp: 98 F (36.7 C)   98.2 F (36.8 C)  TempSrc:       SpO2: 98% 98% 98% 98%  Weight:      Height:        Intake/Output Summary (Last 24 hours) at 01/31/2022 0758 Last data filed at 01/25/2022 2240 Gross per 24 hour  Intake 1000 ml  Output --  Net 1000 ml   Filed Weights   02/03/2022 2030  Weight: 52.2 kg    Examination:   General: Appearance:    Thin male in no acute distress     Lungs:     respirations unlabored  Heart:    Normal heart rate.   MS:   All extremities are intact.    Neurologic:   Awake       Data Reviewed: I have personally reviewed following labs and imaging studies  CBC: Recent Labs  Lab 02/04/2022 2127 01/31/22 0132 01/31/22 0513  WBC 12.0* 10.6* 12.6*  NEUTROABS 10.7* 9.4*  --   HGB 9.8* 11.7* 11.9*  HCT 33.4* 40.7 40.9  MCV 87.7 88.7 88.9  PLT 667* 652* 921*   Basic Metabolic Panel: Recent Labs  Lab 02/12/2022 2127 01/31/22 0132 01/31/22 0513  NA 139 139 140  K 5.7* 5.6* 5.9*  CL 109 109 108  CO2 26 24 22   GLUCOSE 115* 106* 112*  BUN 42* 39* 36*  CREATININE 1.30* 1.17 1.07  CALCIUM 8.4* 8.2* 8.8*   GFR: Estimated Creatinine Clearance: 36.6 mL/min (by C-G formula based on SCr of  1.07 mg/dL). Liver Function Tests: Recent Labs  Lab 02/15/2022 2127 01/31/22 0132 01/31/22 0513  AST 20 20 23   ALT 21 20 23   ALKPHOS 117 122 143*  BILITOT 0.4 0.5 0.4  PROT 6.9 6.9 7.3  ALBUMIN 2.7* 2.8* 2.9*   No results for input(s): "LIPASE", "AMYLASE" in the last 168 hours. No results for input(s): "AMMONIA" in the last 168 hours. Coagulation Profile: Recent Labs  Lab 01/31/22 0513  INR 1.1   Cardiac Enzymes: No results for input(s): "CKTOTAL", "CKMB", "CKMBINDEX", "TROPONINI" in the last 168 hours. BNP (last 3 results) No results for input(s): "PROBNP" in the last 8760 hours. HbA1C: Recent Labs    01/31/22 0513  HGBA1C 6.1*   CBG: No results for input(s): "GLUCAP" in the last 168 hours. Lipid Profile: No results for input(s): "CHOL", "HDL", "LDLCALC", "TRIG", "CHOLHDL",  "LDLDIRECT" in the last 72 hours. Thyroid Function Tests: No results for input(s): "TSH", "T4TOTAL", "FREET4", "T3FREE", "THYROIDAB" in the last 72 hours. Anemia Panel: No results for input(s): "VITAMINB12", "FOLATE", "FERRITIN", "TIBC", "IRON", "RETICCTPCT" in the last 72 hours. Sepsis Labs: No results for input(s): "PROCALCITON", "LATICACIDVEN" in the last 168 hours.  No results found for this or any previous visit (from the past 240 hour(s)).       Radiology Studies: CT Head Wo Contrast  Result Date: 02/16/2022 CLINICAL DATA:  Decreased activity and p.o. intake; history of lung cancer EXAM: CT HEAD WITHOUT CONTRAST TECHNIQUE: Contiguous axial images were obtained from the base of the skull through the vertex without intravenous contrast. RADIATION DOSE REDUCTION: This exam was performed according to the departmental dose-optimization program which includes automated exposure control, adjustment of the mA and/or kV according to patient size and/or use of iterative reconstruction technique. COMPARISON:  12/22/2017 CT head FINDINGS: Brain: Masslike hypodensity in the posterior left frontal lobe, with slightly increased density peripherally, which measures approximately 2.4 x 1.9 x 2.5 cm (series 2, image 26 and series 5, image 44), concerning for a metastatic lesion. Significant adjacent hypodensity, most likely associated edema, which extends into the frontal and parietal lobes. Mild mass effect on the left lateral ventricle. Additional 0.8 cm hypodense lesion in the left midbrain (series 2, image 14). These lesions are new from the prior CT head No acute infarct, hemorrhage, or midline shift. No hydrocephalus or extra-axial collection. Vascular: No hyperdense vessel. Skull: No acute fracture or suspicious lesion. Sinuses/Orbits: No acute finding. Other: The mastoids are well aerated. IMPRESSION: Findings concerning for metastatic disease in the brain, with the dominant lesion in the posterior left  frontal lobe, measuring up to 2.5 cm, with significant surrounding edema, and an additional subcentimeter lesion in the left midbrain. This could be further evaluated with MRI with and without contrast if clinically indicated. These results were called by telephone at the time of interpretation on 01/23/2022 at 10:05 pm to provider [facility] ZAMMIT , who verbally acknowledged these results. Electronically Signed   By: Merilyn Baba M.D.   On: 02/15/2022 22:04   DG Chest Port 1 View  Result Date: 02/19/2022 CLINICAL DATA:  Weakness. EXAM: PORTABLE CHEST 1 VIEW COMPARISON:  07/26/2021, 08/23/2021. FINDINGS: The heart is enlarged and the mediastinal contour stable. There is atherosclerotic calcification of the aorta. There is redemonstration of a left upper lobe mass with left upper lobe collapse. A new masslike region is seen in the right upper lobe and in the infrahilar region on the right. No effusion or pneumothorax. No acute osseous abnormality. IMPRESSION: Left upper lobe, right upper  lobe, and right infrahilar known masses in the lungs with left upper lobe collapse, unchanged from prior CT. No definite acute infiltrate is seen. Electronically Signed   By: Brett Fairy M.D.   On: 01/29/2022 21:38        Scheduled Meds:  dexamethasone (DECADRON) injection  4 mg Intravenous Q8H   heparin  5,000 Units Subcutaneous Q8H   Continuous Infusions:  sodium chloride 75 mL/hr at 01/31/22 0219     LOS: 1 day    Time spent: 45 minutes spent on chart review, discussion with nursing staff, consultants, updating family and interview/physical exam; more than 50% of that time was spent in counseling and/or coordination of care.    Geradine Girt, DO [facility] Available via Epic secure chat 7am-7pm After these hours, please refer to coverage provider listed on [email or web address] 01/31/2022, 7:58 AM

## 2022-01-31 NOTE — ED Notes (Signed)
Unable to do neuro assessment accurately. Pt very restless and confuse.

## 2022-02-01 DIAGNOSIS — R627 Adult failure to thrive: Secondary | ICD-10-CM | POA: Diagnosis not present

## 2022-02-21 NOTE — Progress Notes (Signed)
Thomas MD notified of pt expiring and pt nephew at the bedside and aware. Nephew alerting the other family members at this time.

## 2022-02-21 NOTE — ED Notes (Signed)
Spoke to family, gave them an update.

## 2022-02-21 NOTE — Progress Notes (Signed)
Agree with previous nurse assessment. Pt resting comfortably in bed/

## 2022-02-21 NOTE — Progress Notes (Signed)
Daily Progress Note   Patient Name: [name] [name]       Date: 24-Feb-2022 DOB: 03/27/1933  Age: 88 y.o. MRN#: [id number] Attending Physician: [name] Boll, MD Primary Care Physician: [name] Nova, MD Admit Date: 01/25/2022 Length of Stay: 2 days  Reason for Consultation/Follow-up:  End of life care  Subjective:   CC: Patient is confused.  Randomly able to say yes or no.  Following up regarding end-of-life care management.  Subjective:  Patient transferred out of ER to floor bed this morning.  Presented to bedside.  Patient laying in bed.  No family present at bedside.  Patient seen picking at items that were not present on bed.  Peers shaky and agitated.  Patient able to acknowledge my presence by looking at me though not appropriately answering questions and randomly saying yes or no.  Patient had pulled off oxygen so left nasal cannula off since patient found uncomfortable.  Patient remains on comfort focused care.  Spoke to RN about providing as needed Ativan for agitation.  Coordinated care with case manager/TOC and primary hospitalist.  Review of Systems  Objective:   Vital Signs:  BP (!) 152/85 (BP Location: Right Arm)   Pulse (!) 47   Temp 98 F (36.7 C)   Resp 19   Ht 6' (1.829 m)   Wt 52.2 kg   SpO2 100%   BMI 15.60 kg/m   Physical Exam: General: Frail, chronically ill-appearing, cachectic, pleasantly confused and able to say yes or no randomly Eyes: dry mucous membranes Cardiovascular: RRR Respiratory: Increased work of breathing noted Abdomen: distended Extremities: edema in LE b/l Skin: no rashes or lesions on visible skin Neuro: Confused, picking at items not present on bed Psych: Agitated  Assessment & Plan:   Assessment: Patient is an 88 year old male with a past medical history significant for hyperlipidemia, hypertension, rhabdomyolysis, schizophrenia, congestive heart failure, and lung cancer (declined cancer directed therapies) who  was admitted on 01/29/2022 due to increased confusion and decreased p.o. intake.  As per chart review patient was at home with [name] [name] hospice support.  Imaging obtained in ER has shown new metastatic disease to the brain associated with edema.  Palliative care consulted to assist with complex medical decision-making.   Recommendations/Plan: # Complex medical decision making/goals of care:  -Unable to participate in medical decision making secondary to his mental status.                -Had discussed with with patient's niece, nephew, and daily caretaker on 11/10 and transitioning to comfort focused care.  Patient's niece and nephew have been assisting in making medical decisions for patient and he is unable.  They confirmed patient did not want any cancer directed therapies or his life prolonged and would want to focus on comfort focused care.   -Patient does not have a safe discharge to home as does not have 24/7 caregiver.  TOC referral to assist with disposition coordination.  Hospice liaison can determine if excepted to inpatient facility for [facility] management.    -  Code Status: DNR  # Symptom management:   -Pain/Dyspnea, acute in the setting of metastatic lung cancer                              -Continue IV Dilaudid continuous infusion at 0.2 mg/hr to assist with management of dyspnea and pain. -Continue IV Dilaudid 0.2 mg every 1 hour as needed  for breakthrough management.  Continue to adjust based on patient's symptom burden.                   -Anxiety/agitation, in the setting of end-of-life care                               -Continue IV Ativan 0.5 mg every 4 hours as needed. Continue to adjust based on patient's symptom burden.                                 -And also has IV Haldol 0.5 mg every 4 hours as needed. Continue to adjust based on patient's symptom burden.                   -Secretions, in the setting of end-of-life care                               -Continue IV  glycopyrrolate 0.2 mg every 4 hours as needed.   # Psycho-social/Spiritual Support:  - Support System: Niece, nephew, [name] [name]  # Discharge Planning: Continue comfort focused care in hospital.  [name] referral placed to assist with disposition.  Hospice liaison can evaluate to determine if patient qualifies for GIP admission to hospice facility.  Discussed with: bedside RN, hospitalist  Thank you for allowing the palliative care team to participate in the care [name] [name].  [name] Aus, DO Palliative Care Provider PMT # [phone]  If patient remains symptomatic despite maximum doses, please call PMT at [phone] between 0700 and 1900. Outside of these hours, please call attending, as PMT does not have night coverage.  This provider spent a total of 37 minutes providing patient's care.  Includes review of EMR, discussing care with other staff members involved in patient's medical care, obtaining relevant history and information from patient and/or patient's family, and personal review of imaging and lab work. Greater than 50% of the time was spent counseling and coordinating care related to the above assessment and plan.

## 2022-02-21 NOTE — Progress Notes (Signed)
PROGRESS NOTE    [name] [name]  MGQ:[id number] DOB: [birth year] DOA: 02/15/2022 PCP: Roselee Nova, MD    Brief Narrative:  [name] [name] is a 88 y.o. male with medical history significant of HLD, HTN ,  lung cancer (declined treatment per chart review), rhabdomyolysis, schizophrenia, and congestive heart failure ef 15-25 on echo 2019, who presents to ED BIB EMS due to decrease po intake and confusion from baseline as well as generalized weakness. Per chart patient is currently being followed by hospice.Patient is not able to give history at this time.  02/05/2022: Patient seen.  Also discussed with patient's nurse.  Comfort directed care is in place.  Palliative care input is highly appreciated.  Assessment and Plan: Acute Encephalopathy -multifactorial, new brain mets, poor po intake with severe dehydration  -supportive care for underlying cause    New metastasis to the Brain -noted area of edema -start decadron  -DNI/DNR  -poor prognosis  -Comfort directed care.     Failure to thrive  -insetting of inoperable lung ca  with mets to brain  -not on treatment due to patient preference  -Palliative care input is appreciated. -Comfort directed care.   AKI  -Comfort measures.   Hyperkalemia -lokelma if able to take PO   HLD/HTN -hold med currently    Schizophrenia -resume home regimen as able once med recompleted    Congestive heart failure  -ef 15-25% on echo 2019, -Comfort directed care.   DVT prophylaxis:     Code Status: DNR   Disposition Plan:  Level of care: Med-Surg Status is: Inpatient Remains inpatient appropriate      Consultants:  Palliative care-- ? On hospice: oscar  Subjective: No history from patient.  Objective: Vitals:   Feb 05, 2022 0119 Feb 05, 2022 0120 02/05/2022 0439 02/05/22 0532  BP: 115/87 115/87 (!) 153/85 (!) 145/83  Pulse:  88 95 (!) 45  Resp: 18 15 (!) 26 18  Temp:  98.5 F (36.9 C)  98.1 F (36.7 C)  TempSrc:  Oral     SpO2: 99% 99% 100% 91%  Weight:      Height:        Intake/Output Summary (Last 24 hours) at 02/05/22 0827 Last data filed at 01/31/2022 1235 Gross per 24 hour  Intake 770 ml  Output --  Net 770 ml    Filed Weights   02/08/2022 2030  Weight: 52.2 kg    Examination:   General: Appearance:  Patient is cachectic.  Patient is not in any distress.  Right lower extremity contractures noted.       Lungs:     respirations unlabored  Heart:    Bradycardic.   MS:   All extremities are intact.  Contracture right lower extremity.   Neurologic:   Awake       Data Reviewed: I have personally reviewed following labs and imaging studies  CBC: Recent Labs  Lab 02/03/2022 2127 01/31/22 0132 01/31/22 0513  WBC 12.0* 10.6* 12.6*  NEUTROABS 10.7* 9.4*  --   HGB 9.8* 11.7* 11.9*  HCT 33.4* 40.7 40.9  MCV 87.7 88.7 88.9  PLT 667* 652* 705*    Basic Metabolic Panel: Recent Labs  Lab 02/16/2022 2127 01/31/22 0132 01/31/22 0513  NA 139 139 140  K 5.7* 5.6* 5.9*  CL 109 109 108  CO2 26 24 22   GLUCOSE 115* 106* 112*  BUN 42* 39* 36*  CREATININE 1.30* 1.17 1.07  CALCIUM 8.4* 8.2* 8.8*    GFR: Estimated Creatinine  Clearance: 36.6 mL/min (by C-G formula based on SCr of 1.07 mg/dL). Liver Function Tests: Recent Labs  Lab 02/17/2022 2127 01/31/22 0132 01/31/22 0513  AST 20 20 23   ALT 21 20 23   ALKPHOS 117 122 143*  BILITOT 0.4 0.5 0.4  PROT 6.9 6.9 7.3  ALBUMIN 2.7* 2.8* 2.9*    No results for input(s): "LIPASE", "AMYLASE" in the last 168 hours. No results for input(s): "AMMONIA" in the last 168 hours. Coagulation Profile: Recent Labs  Lab 01/31/22 0513  INR 1.1    Cardiac Enzymes: No results for input(s): "CKTOTAL", "CKMB", "CKMBINDEX", "TROPONINI" in the last 168 hours. BNP (last 3 results) No results for input(s): "PROBNP" in the last 8760 hours. HbA1C: Recent Labs    01/31/22 0513  HGBA1C 6.1*    CBG: Recent Labs  Lab 01/31/22 0801  GLUCAP 103*    Lipid Profile: No results for input(s): "CHOL", "HDL", "LDLCALC", "TRIG", "CHOLHDL", "LDLDIRECT" in the last 72 hours. Thyroid Function Tests: No results for input(s): "TSH", "T4TOTAL", "FREET4", "T3FREE", "THYROIDAB" in the last 72 hours. Anemia Panel: No results for input(s): "VITAMINB12", "FOLATE", "FERRITIN", "TIBC", "IRON", "RETICCTPCT" in the last 72 hours. Sepsis Labs: No results for input(s): "PROCALCITON", "LATICACIDVEN" in the last 168 hours.  No results found for this or any previous visit (from the past 240 hour(s)).       Radiology Studies: CT Head Wo Contrast  Result Date: 01/23/2022 CLINICAL DATA:  Decreased activity and p.o. intake; history of lung cancer EXAM: CT HEAD WITHOUT CONTRAST TECHNIQUE: Contiguous axial images were obtained from the base of the skull through the vertex without intravenous contrast. RADIATION DOSE REDUCTION: This exam was performed according to the departmental dose-optimization program which includes automated exposure control, adjustment of the mA and/or kV according to patient size and/or use of iterative reconstruction technique. COMPARISON:  12/22/2017 CT head FINDINGS: Brain: Masslike hypodensity in the posterior left frontal lobe, with slightly increased density peripherally, which measures approximately 2.4 x 1.9 x 2.5 cm (series 2, image 26 and series 5, image 44), concerning for a metastatic lesion. Significant adjacent hypodensity, most likely associated edema, which extends into the frontal and parietal lobes. Mild mass effect on the left lateral ventricle. Additional 0.8 cm hypodense lesion in the left midbrain (series 2, image 14). These lesions are new from the prior CT head No acute infarct, hemorrhage, or midline shift. No hydrocephalus or extra-axial collection. Vascular: No hyperdense vessel. Skull: No acute fracture or suspicious lesion. Sinuses/Orbits: No acute finding. Other: The mastoids are well aerated. IMPRESSION: Findings  concerning for metastatic disease in the brain, with the dominant lesion in the posterior left frontal lobe, measuring up to 2.5 cm, with significant surrounding edema, and an additional subcentimeter lesion in the left midbrain. This could be further evaluated with MRI with and without contrast if clinically indicated. These results were called by telephone at the time of interpretation on 02/15/2022 at 10:05 pm to provider [facility] ZAMMIT , who verbally acknowledged these results. Electronically Signed   By: Merilyn Baba M.D.   On: 02/18/2022 22:04   DG Chest Port 1 View  Result Date: 01/23/2022 CLINICAL DATA:  Weakness. EXAM: PORTABLE CHEST 1 VIEW COMPARISON:  07/26/2021, 08/23/2021. FINDINGS: The heart is enlarged and the mediastinal contour stable. There is atherosclerotic calcification of the aorta. There is redemonstration of a left upper lobe mass with left upper lobe collapse. A new masslike region is seen in the right upper lobe and in the infrahilar region on the right.  No effusion or pneumothorax. No acute osseous abnormality. IMPRESSION: Left upper lobe, right upper lobe, and right infrahilar known masses in the lungs with left upper lobe collapse, unchanged from prior CT. No definite acute infiltrate is seen. Electronically Signed   By: Brett Fairy M.D.   On: 02/20/2022 21:38        Scheduled Meds:  dexamethasone (DECADRON) injection  4 mg Intravenous Q8H   Continuous Infusions:  HYDROmorphone Stopped (01/31/22 2204)     LOS: 2 days    Time spent: 35 minutes    Bonnell Public, MD [facility] Available via Epic secure chat 7am-7pm After these hours, please refer to coverage provider listed on [email or web address] 03-Feb-2022, 8:27 AM

## 2022-02-21 NOTE — Progress Notes (Signed)
Called to room by family. Pt observed apnea. Auscultated apical pulse, no pulse found. Pt pronounced with second RN Rebekah Fail RN at 1640.

## 2022-02-21 DEATH — deceased

## 2022-03-24 NOTE — Discharge Summary (Signed)
        Physician Discharge Summary  [name] [name] YME:[id number] DOB: 09/03/1933 DOA: 2022/02/16  PCP: Roselee Nova, MD  Admit date: 02/16/22 Patient died on: 2022-02-18.   (Patient was pronounced by Micheline Chapman on 2022-02-18 at 4.40 PM)  Brief Narrative:  [name] [name] was an 89 y.o. male with medical history significant of HLD, HTN ,  lung cancer (declined treatment per chart review), rhabdomyolysis, schizophrenia, and congestive heart failure ef 15-25 on echo 2019, who presents to ED due to decrease po intake and confusion from baseline, as well as, generalized weakness. Per chart patient was being followed by hospice.  Patient was admitted and managed for acute encephalopathy, poor p.o. intake, acute kidney injury, hyperkalemia, metastatic neoplasm to the brain.  Patient was managed supportively and made comfortable.  Patient died on 2022/02/18.   February 18, 2022: Comfort directed care was in place.  Palliative care team was also following the patient.     Discharge Diagnoses:  Failure to thrive in adult Acute encephalopathy. Acute kidney injury. Hyperkalemia.   Dehydration   Pain   Dyspnea   Agitation   High risk medication use   Malignant neoplasm metastatic to brain (HCC)   End of life care   Counseling and coordination of care   Primary malignant neoplasm of lung metastatic to other site [facility])   Palliative care encounter   DNR (do not resuscitate) Hyperlipidemia. Hypertension. Congestive heart failure with EF of 15 to 25% as per echo done in 2019. Schizophrenia.    Signed:  Dana Allan, MD  [facility] Pager #: [phone] 7PM-7AM contact night coverage as above
